# Patient Record
Sex: Female | Born: 1980 | Race: White | Hispanic: No | Marital: Married | Smoking: Never smoker
Health system: Southern US, Community
[De-identification: ages and names within clinical notes are randomized; demographics above are authoritative.]

## PROBLEM LIST (undated history)

## (undated) DIAGNOSIS — IMO0001 Reserved for inherently not codable concepts without codable children: Secondary | ICD-10-CM

## (undated) DIAGNOSIS — Z8619 Personal history of other infectious and parasitic diseases: Secondary | ICD-10-CM

## (undated) DIAGNOSIS — Z789 Other specified health status: Secondary | ICD-10-CM

## (undated) DIAGNOSIS — Z87440 Personal history of urinary (tract) infections: Secondary | ICD-10-CM

## (undated) HISTORY — PX: NO PAST SURGERIES: SHX2092

## (undated) HISTORY — DX: Personal history of urinary (tract) infections: Z87.440

## (undated) HISTORY — DX: Personal history of other infectious and parasitic diseases: Z86.19

---

## 2008-07-22 ENCOUNTER — Inpatient Hospital Stay (HOSPITAL_COMMUNITY): Admission: AD | Admit: 2008-07-22 | Discharge: 2008-07-25 | Payer: Self-pay | Admitting: Obstetrics and Gynecology

## 2008-08-05 DIAGNOSIS — Z87440 Personal history of urinary (tract) infections: Secondary | ICD-10-CM

## 2008-08-05 HISTORY — DX: Personal history of urinary (tract) infections: Z87.440

## 2010-12-18 NOTE — H&P (Signed)
NAMEKAREY, SUTHERS            ACCOUNT NO.:  [id number]   MEDICAL RECORD NO.:  [id number]          PATIENT TYPE:  INP   LOCATION:  9198                          FACILITY:  [facility]   PHYSICIAN:  Crist Fat. Rivard, M.D. DATE OF BIRTH:  08/26/1980   DATE OF ADMISSION:  07/22/2008  DATE OF DISCHARGE:                              HISTORY & PHYSICAL   Ms. Beevers is a 30 year old gravida 1 at 40 weeks and 1 day gestation  who presents with increased contractions and  a history of being 2 cm in  the office yesterday.  Her pregnancy was remarkable for having a narrow  pelvic outlet noted on her initial exam.  Her prenatal labs include an  initial prenatal panel that showed a blood type of O positive, antibody  screen negative.  Her initial hematocrit was 36.2.  Her initial  hemoglobin was 12.6.  Her platelets were 244.  RPR was nonreactive  Rubella titer immune.  Hepatitis B negative.  HIV nonreactive.  Negative  gonorrhea.  Negative chlamydia. Normal Pap was done in June of 2009 at  another practice.  She declines alpha-fetoprotein.  Her Glucola at  approximately [redacted] weeks gestation was normal at 127.  At that time her  hemoglobin was 12.5 and her group B strep culture of the vagina was  negative on November 19.   CURRENT MEDICATIONS:  Prenatal vitamins only.   HISTORY OF PRESENT PREGNANCY ONLY:  Ms. Derhonda entered prenatal care  at [facility] with new OB interview on June 30.  On July 8  she had her new OB exam.  She had previously had an early ultrasound for  dating and a first trimester screen at [facility].  She was 16  weeks and 6 days at her first visit with [place].  In the middle of July she  saw a dermatologist for wart removal on her finger.  Per the records,  her cystic fibrosis screen was negative and her first trimester screen  was normal and an ultrasound was done for growth at our office at 18  weeks.  Size was consistent with date.  Cervix was 3.77 cm long  with  posterior placenta and the anatomy was seen to be complete.  At 26 weeks  she had normal Glucola and hemoglobin of 12.5.  She was offered the H1N1  vaccine and declined it.  She was encouraged to get the seasonal flu  vaccine, also declined that.  Her group B beta strep test was negative  on November 19 and the final month of her pregnancy was unremarkable.   OBSTETRIC HISTORY:  This is her first pregnancy.   ALLERGIES:  She has no known allergies.   MEDICAL HISTORY:  She has no ongoing health problems.  She did have a  traumatic injury to her left arm at age 72, a compound fracture, and her  surgical history includes extraction of wisdom teeth as well as surgical  correction of the compound fracture on her left arm.   GENETIC HISTORY:  Noncontributory.  Negative cystic fibrosis screen.   SOCIAL HISTORY:  She is married to  Silvia Hightower, who is a  chiropractic doctor.  Eldena Swigert, the patient, has a Energy manager  degree and her occupation is Dance movement psychotherapist.  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  She is Caucasian and she denies use of  alcohol, tobacco, or street drugs during the pregnancy.   PHYSICAL EXAMINATION:  VITAL SIGNS:  Stable.  She is afebrile.  HEENT:  Within normal limits.  LUNGS:  Clear to auscultation.  HEART:  Regular rate and rhythm.  BREASTS:  Soft, nontender.  ABDOMEN:  Gravid.  Soft to palpation between contractions.  Fetal heart  rate 130, reactive, and reassuring with multiple accelerations and  obvious fetal activity.  EXTREMITIES:  No edema of extremities.  DTRs +3, +3.  Negative Homans  and clonus x2.   Vag exam at 1845 showed the cervix to be dilated 5 cm, 100% effaced, and  -1 station with a vertex presentation.  Patient got up to ambulate and  had a spontaneous rupture of membrane at 1851 with clear fluid running  down her legs.   IMPRESSION:  A 30 year old gravida 1 at 40.1 weeks, negative group B  streptococcus, narrow  pelvic outlet, active labor.   PLAN:  Admit to birthing suites under CNM management.  Routine orders  are initiated.  Dr. Estanislado Pandy has been consulted and advised.      Janna Melsness, CNM      ______________________________  Crist Fat Rivard, M.D.    JM/MEDQ  D:  07/22/2008  T:  07/22/2008  Job:  [id number]

## 2011-05-10 LAB — CBC
HCT: 41.8 % (ref 36.0–46.0)
Hemoglobin: 14.3 g/dL (ref 12.0–15.0)
MCHC: 34.3 g/dL (ref 30.0–36.0)
MCHC: 34.3 g/dL (ref 30.0–36.0)
MCV: 97.2 fL (ref 78.0–100.0)
Platelets: 152 10*3/uL (ref 150–400)
Platelets: 174 10*3/uL (ref 150–400)
RDW: 13.1 % (ref 11.5–15.5)
WBC: 13.4 10*3/uL — ABNORMAL HIGH (ref 4.0–10.5)

## 2011-08-06 NOTE — L&D Delivery Note (Signed)
Delivery Note At 8:40 AM a viable female was delivered via Vaginal, Spontaneous Delivery (Presentation: Right Occiput Anterior).  APGAR:8 ,8 ; weight .   Placenta status: Intact, Spontaneous schultze.  Cord: 3 vessels.  Anesthesia: Epidural  Episiotomy: None Lacerations: 2nd degree Suture Repair: 3.0 monocryl Est. Blood Loss (mL): 200  Mom to postpartum.  Baby to rooming in.  Oseias Horsey 02/24/2012, 9:21 AM

## 2011-09-05 LAB — ANTIBODY SCREEN: Antibody Screen: NEGATIVE

## 2011-09-05 LAB — HIV ANTIBODY (ROUTINE TESTING W REFLEX): HIV: NONREACTIVE

## 2011-09-05 LAB — CBC
HCT: 38 % (ref 36–46)
Hemoglobin: 13.3 g/dL (ref 12.0–16.0)
Platelets: 243 10*3/uL (ref 150–399)

## 2011-09-05 LAB — GC/CHLAMYDIA PROBE AMP, GENITAL
Chlamydia: NEGATIVE
Gonorrhea: NEGATIVE

## 2011-09-05 LAB — ABO/RH: RH Type: POSITIVE

## 2011-09-05 LAB — RPR: RPR: NONREACTIVE

## 2011-09-05 LAB — HEPATITIS B SURFACE ANTIGEN: Hepatitis B Surface Ag: NEGATIVE

## 2011-10-17 ENCOUNTER — Encounter (INDEPENDENT_AMBULATORY_CARE_PROVIDER_SITE_OTHER): Payer: Medicaid Other

## 2011-10-17 DIAGNOSIS — Z331 Pregnant state, incidental: Secondary | ICD-10-CM

## 2011-11-14 ENCOUNTER — Ambulatory Visit (INDEPENDENT_AMBULATORY_CARE_PROVIDER_SITE_OTHER): Payer: Medicaid Other | Admitting: Obstetrics and Gynecology

## 2011-11-14 ENCOUNTER — Other Ambulatory Visit: Payer: Medicaid Other

## 2011-11-14 VITALS — BP 112/66 | Ht 66.5 in | Wt 141.0 lb

## 2011-11-14 DIAGNOSIS — Z331 Pregnant state, incidental: Secondary | ICD-10-CM

## 2011-11-14 LAB — HEMOGLOBIN: Hemoglobin: 12 g/dL (ref 12.0–15.0)

## 2011-11-14 NOTE — Progress Notes (Signed)
   The patient  has no unusual complaints except occ mild cramping

## 2011-11-14 NOTE — Progress Notes (Signed)
GLUCOLA GIVEN TODAY.

## 2011-11-25 ENCOUNTER — Telehealth: Payer: Self-pay | Admitting: Obstetrics and Gynecology

## 2011-11-25 NOTE — Telephone Encounter (Signed)
Routed to triage 

## 2011-11-25 NOTE — Telephone Encounter (Signed)
lmo vm dental note faxed to dentist

## 2011-11-29 ENCOUNTER — Encounter: Payer: Medicaid Other | Admitting: Obstetrics and Gynecology

## 2011-11-29 ENCOUNTER — Ambulatory Visit (INDEPENDENT_AMBULATORY_CARE_PROVIDER_SITE_OTHER): Payer: Medicaid Other | Admitting: Obstetrics and Gynecology

## 2011-11-29 VITALS — BP 108/66 | Wt 142.0 lb

## 2011-11-29 DIAGNOSIS — Z331 Pregnant state, incidental: Secondary | ICD-10-CM

## 2011-11-29 NOTE — Progress Notes (Signed)
Glucola 116.  RPR nonreactive.  Hemoglobin 12.  Doing well.  Return to office in 2 weeks.  Dr. Stefano Gaul

## 2011-12-17 ENCOUNTER — Ambulatory Visit (INDEPENDENT_AMBULATORY_CARE_PROVIDER_SITE_OTHER): Payer: Medicaid Other | Admitting: Obstetrics and Gynecology

## 2011-12-17 ENCOUNTER — Encounter: Payer: Self-pay | Admitting: Obstetrics and Gynecology

## 2011-12-17 VITALS — BP 90/54 | Wt 148.0 lb

## 2011-12-17 DIAGNOSIS — Z331 Pregnant state, incidental: Secondary | ICD-10-CM

## 2011-12-17 NOTE — Progress Notes (Signed)
No concerns per pt 

## 2011-12-17 NOTE — Progress Notes (Signed)
Patient ID: Brittany Atkinson, female   DOB: 1981/03/04, 31 y.o.   MRN: [id number] Reviewed s/s preterm labor, srom, vag bleeding, kick counts to report, enc 8 water daily and frequent voids Lavera Guise, CNM

## 2011-12-27 ENCOUNTER — Encounter: Payer: Medicaid Other | Admitting: Obstetrics and Gynecology

## 2011-12-31 ENCOUNTER — Ambulatory Visit (INDEPENDENT_AMBULATORY_CARE_PROVIDER_SITE_OTHER): Payer: Medicaid Other | Admitting: Obstetrics and Gynecology

## 2011-12-31 ENCOUNTER — Encounter: Payer: Self-pay | Admitting: Obstetrics and Gynecology

## 2011-12-31 VITALS — BP 100/62 | Wt 153.0 lb

## 2011-12-31 DIAGNOSIS — O0930 Supervision of pregnancy with insufficient antenatal care, unspecified trimester: Secondary | ICD-10-CM

## 2011-12-31 DIAGNOSIS — F329 Major depressive disorder, single episode, unspecified: Secondary | ICD-10-CM

## 2011-12-31 DIAGNOSIS — Z331 Pregnant state, incidental: Secondary | ICD-10-CM

## 2011-12-31 DIAGNOSIS — F3289 Other specified depressive episodes: Secondary | ICD-10-CM

## 2011-12-31 NOTE — Patient Instructions (Signed)
Fetal Movement Counts Patient Name: __________________________________________________ Patient Due Date: ____________________ Kick counts is highly recommended in high risk pregnancies, but it is a good idea for every pregnant woman to do. Start counting fetal movements at 28 weeks of the pregnancy. Fetal movements increase after eating a full meal or eating or drinking something sweet (the blood sugar is higher). It is also important to drink plenty of fluids (well hydrated) before doing the count. Lie on your left side because it helps with the circulation or you can sit in a comfortable chair with your arms over your belly (abdomen) with no distractions around you. DOING THE COUNT  Try to do the count the same time of day each time you do it.   Mark the day and time, then see how long it takes for you to feel 10 movements (kicks, flutters, swishes, rolls). You should have at least 10 movements within 2 hours. You will most likely feel 10 movements in much less than 2 hours. If you do not, wait an hour and count again. After a couple of days you will see a pattern.   What you are looking for is a change in the pattern or not enough counts in 2 hours. Is it taking longer in time to reach 10 movements?  SEEK MEDICAL CARE IF:  You feel less than 10 counts in 2 hours. Tried twice.   No movement in one hour.   The pattern is changing or taking longer each day to reach 10 counts in 2 hours.   You feel the baby is not moving as it usually does.  Date: ____________ Movements: ____________ Start time: ____________ Finish time: ____________  Date: ____________ Movements: ____________ Start time: ____________ Finish time: ____________ Date: ____________ Movements: ____________ Start time: ____________ Finish time: ____________ Date: ____________ Movements: ____________ Start time: ____________ Finish time: ____________ Date: ____________ Movements: ____________ Start time: ____________ Finish time:  ____________ Date: ____________ Movements: ____________ Start time: ____________ Finish time: ____________ Date: ____________ Movements: ____________ Start time: ____________ Finish time: ____________ Date: ____________ Movements: ____________ Start time: ____________ Finish time: ____________  Date: ____________ Movements: ____________ Start time: ____________ Finish time: ____________ Date: ____________ Movements: ____________ Start time: ____________ Finish time: ____________ Date: ____________ Movements: ____________ Start time: ____________ Finish time: ____________ Date: ____________ Movements: ____________ Start time: ____________ Finish time: ____________ Date: ____________ Movements: ____________ Start time: ____________ Finish time: ____________ Date: ____________ Movements: ____________ Start time: ____________ Finish time: ____________ Date: ____________ Movements: ____________ Start time: ____________ Finish time: ____________  Date: ____________ Movements: ____________ Start time: ____________ Finish time: ____________ Date: ____________ Movements: ____________ Start time: ____________ Finish time: ____________ Date: ____________ Movements: ____________ Start time: ____________ Finish time: ____________ Date: ____________ Movements: ____________ Start time: ____________ Finish time: ____________ Date: ____________ Movements: ____________ Start time: ____________ Finish time: ____________ Date: ____________ Movements: ____________ Start time: ____________ Finish time: ____________ Date: ____________ Movements: ____________ Start time: ____________ Finish time: ____________  Date: ____________ Movements: ____________ Start time: ____________ Finish time: ____________ Date: ____________ Movements: ____________ Start time: ____________ Finish time: ____________ Date: ____________ Movements: ____________ Start time: ____________ Finish time: ____________ Date: ____________ Movements:  ____________ Start time: ____________ Finish time: ____________ Date: ____________ Movements: ____________ Start time: ____________ Finish time: ____________ Date: ____________ Movements: ____________ Start time: ____________ Finish time: ____________ Date: ____________ Movements: ____________ Start time: ____________ Finish time: ____________  Date: ____________ Movements: ____________ Start time: ____________ Finish time: ____________ Date: ____________ Movements: ____________ Start time: ____________ Finish time: ____________ Date: ____________ Movements: ____________ Start time:   ____________ Finish time: ____________ Date: ____________ Movements: ____________ Start time: ____________ Finish time: ____________ Date: ____________ Movements: ____________ Start time: ____________ Finish time: ____________ Date: ____________ Movements: ____________ Start time: ____________ Finish time: ____________ Date: ____________ Movements: ____________ Start time: ____________ Finish time: ____________  Date: ____________ Movements: ____________ Start time: ____________ Finish time: ____________ Date: ____________ Movements: ____________ Start time: ____________ Finish time: ____________ Date: ____________ Movements: ____________ Start time: ____________ Finish time: ____________ Date: ____________ Movements: ____________ Start time: ____________ Finish time: ____________ Date: ____________ Movements: ____________ Start time: ____________ Finish time: ____________ Date: ____________ Movements: ____________ Start time: ____________ Finish time: ____________ Date: ____________ Movements: ____________ Start time: ____________ Finish time: ____________  Date: ____________ Movements: ____________ Start time: ____________ Finish time: ____________ Date: ____________ Movements: ____________ Start time: ____________ Finish time: ____________ Date: ____________ Movements: ____________ Start time: ____________ Finish  time: ____________ Date: ____________ Movements: ____________ Start time: ____________ Finish time: ____________ Date: ____________ Movements: ____________ Start time: ____________ Finish time: ____________ Date: ____________ Movements: ____________ Start time: ____________ Finish time: ____________ Date: ____________ Movements: ____________ Start time: ____________ Finish time: ____________  Date: ____________ Movements: ____________ Start time: ____________ Finish time: ____________ Date: ____________ Movements: ____________ Start time: ____________ Finish time: ____________ Date: ____________ Movements: ____________ Start time: ____________ Finish time: ____________ Date: ____________ Movements: ____________ Start time: ____________ Finish time: ____________ Date: ____________ Movements: ____________ Start time: ____________ Finish time: ____________ Date: ____________ Movements: ____________ Start time: ____________ Finish time: ____________ Document Released: 08/21/2006 Document Revised: 07/11/2011 Document Reviewed: 02/21/2009 ExitCare Patient Information 2012 [employer].  Preventing Preterm Labor Preterm labor is when a pregnant woman has contractions that cause the cervix to open, shorten, and thin before 37 weeks of pregnancy. You will have regular contractions (tightening) 2 to 3 minutes apart. This usually causes discomfort or pain. HOME CARE  Eat a healthy diet.   Take your vitamins as told by your doctor.   Drink enough fluids to keep your pee (urine) clear or pale yellow every day.   Get rest and sleep.   Do not have sex if you are at high risk for preterm labor.   Follow your doctor's advice about activity, medicines, and tests.   Avoid stress.   Avoid hard labor or exercise that lasts for a long time.   Do not smoke.  GET HELP RIGHT AWAY IF:   You are having contractions.   You have belly (abdominal) pain.   You have bleeding from your vagina.   You  have pain when you pee (urinate).   You have abnormal discharge from your vagina.   You have a temperature by mouth above 102 F (38.9 C).  MAKE SURE YOU:  Understand these instructions.   Will watch your condition.   Will get help if you are not doing well or get worse.  Document Released: 10/18/2008 Document Revised: 07/11/2011 Document Reviewed: 10/18/2008 Exit[employer] Patient Information 2012 [employer]. 

## 2011-12-31 NOTE — Progress Notes (Signed)
[redacted]w[redacted]d Doing well, GFM No ctx, rec maternity belt for comfort, feels "more" this pregnancy rv'd PTL sx's and FKC GBS [place]

## 2011-12-31 NOTE — Progress Notes (Signed)
C/o increased pelvic pressure.

## 2012-01-01 DIAGNOSIS — F329 Major depressive disorder, single episode, unspecified: Secondary | ICD-10-CM | POA: Insufficient documentation

## 2012-01-01 DIAGNOSIS — F32A Depression, unspecified: Secondary | ICD-10-CM | POA: Insufficient documentation

## 2012-01-16 ENCOUNTER — Encounter: Payer: Self-pay | Admitting: Obstetrics and Gynecology

## 2012-01-16 ENCOUNTER — Ambulatory Visit (INDEPENDENT_AMBULATORY_CARE_PROVIDER_SITE_OTHER): Payer: Medicaid Other | Admitting: Obstetrics and Gynecology

## 2012-01-16 VITALS — BP 98/68 | Wt 157.0 lb

## 2012-01-16 DIAGNOSIS — Z331 Pregnant state, incidental: Secondary | ICD-10-CM

## 2012-01-16 DIAGNOSIS — L989 Disorder of the skin and subcutaneous tissue, unspecified: Secondary | ICD-10-CM

## 2012-01-16 NOTE — Progress Notes (Signed)
Doing well.  ? Spider bite over weekend--now with ulceration on right upper leg, with slight erythema and approx 2 cm induration around the central lesion.  No appearance of RMSF bulls-eye lesion.  Wound culture done.  Patient will do warm soaks, declines ATB at present.  I will f/u on culture results and f/u with patient. GBS today. Vtx to [place].

## 2012-01-18 LAB — WOUND CULTURE
Gram Stain: NONE SEEN
Organism ID, Bacteria: NO GROWTH

## 2012-01-19 ENCOUNTER — Encounter: Payer: Self-pay | Admitting: Obstetrics and Gynecology

## 2012-01-19 DIAGNOSIS — O99820 Streptococcus B carrier state complicating pregnancy: Secondary | ICD-10-CM | POA: Insufficient documentation

## 2012-01-29 ENCOUNTER — Ambulatory Visit (INDEPENDENT_AMBULATORY_CARE_PROVIDER_SITE_OTHER): Payer: Medicaid Other | Admitting: Obstetrics and Gynecology

## 2012-01-29 ENCOUNTER — Telehealth: Payer: Self-pay | Admitting: Obstetrics and Gynecology

## 2012-01-29 VITALS — BP 104/60 | Wt 158.0 lb

## 2012-01-29 DIAGNOSIS — Z331 Pregnant state, incidental: Secondary | ICD-10-CM

## 2012-01-29 NOTE — Progress Notes (Signed)
Doing well. Return office in 1 week. Dr. Dewell Monnier 

## 2012-01-29 NOTE — Telephone Encounter (Signed)
Brittany Atkinson/pt will call back

## 2012-01-29 NOTE — Progress Notes (Signed)
During checkout, Misty Stanley states pt forgot to ask Dr about GBS. Dr at lunch. Consulted with SL GBS positive. Pt questioned is there anything she can do to cure it. Informed pt per SL its a normal bacteria and there isn't anything pt can do to cure it.

## 2012-01-29 NOTE — Telephone Encounter (Signed)
Pt calling you back

## 2012-01-29 NOTE — Telephone Encounter (Signed)
Pt request to speak with Dr. AVS regarding +GBS

## 2012-01-29 NOTE — Progress Notes (Signed)
Pt desires cervix check today. Pt just wants to know if it will be soon that she will deliver. Pt is concerned if baby is measuring "ok" and not to big or small.

## 2012-02-04 ENCOUNTER — Ambulatory Visit (INDEPENDENT_AMBULATORY_CARE_PROVIDER_SITE_OTHER): Payer: Medicaid Other | Admitting: Obstetrics and Gynecology

## 2012-02-04 ENCOUNTER — Encounter: Payer: Self-pay | Admitting: Obstetrics and Gynecology

## 2012-02-04 VITALS — BP 100/58 | Wt 158.0 lb

## 2012-02-04 DIAGNOSIS — Z3490 Encounter for supervision of normal pregnancy, unspecified, unspecified trimester: Secondary | ICD-10-CM

## 2012-02-04 DIAGNOSIS — Z331 Pregnant state, incidental: Secondary | ICD-10-CM

## 2012-02-04 NOTE — Progress Notes (Signed)
Reviewed + GBS status--patient questioned if repeat GBS could be done. I do not recommend, since I would continue to recommend GBS tx in labor, regardless of outcome. R&B of GBS treatment vs non-treatment reviewed, including risks of GBS septicemia and fetal death. Patient will make decision regarding acceptance vs declining GBS treatment. Birth plan reviewed--copy to hospital for scanning to chart. Cervix posterior, with external os 2 cm, internal os 1 cm, 50%, vtx -1.

## 2012-02-04 NOTE — Progress Notes (Signed)
Some vaginal irritation off/on. ? Would like to be retested for GBS. Request cx  Check.

## 2012-02-10 ENCOUNTER — Telehealth (HOSPITAL_COMMUNITY): Payer: Self-pay | Admitting: *Deleted

## 2012-02-10 ENCOUNTER — Encounter (HOSPITAL_COMMUNITY): Payer: Self-pay | Admitting: *Deleted

## 2012-02-10 NOTE — Telephone Encounter (Signed)
Preadmission screen  

## 2012-02-13 ENCOUNTER — Encounter: Payer: Self-pay | Admitting: Obstetrics and Gynecology

## 2012-02-13 ENCOUNTER — Ambulatory Visit (INDEPENDENT_AMBULATORY_CARE_PROVIDER_SITE_OTHER): Payer: Medicaid Other | Admitting: Obstetrics and Gynecology

## 2012-02-13 VITALS — BP 102/60 | Wt 160.0 lb

## 2012-02-13 DIAGNOSIS — Z331 Pregnant state, incidental: Secondary | ICD-10-CM

## 2012-02-13 DIAGNOSIS — Z3490 Encounter for supervision of normal pregnancy, unspecified, unspecified trimester: Secondary | ICD-10-CM

## 2012-02-13 NOTE — Progress Notes (Signed)
[redacted]w[redacted]d Desires repeat GBS -  Repeated - patient had been advised that it is not recommended to repeat this test but insisted SVE: 2/50%/-1, Vx. Will call patient with GBS result  ROB 1 week Labor precautions discussed

## 2012-02-13 NOTE — Progress Notes (Signed)
Pt would like to know if GBS can be repeated although she previously had a positive result. Pt request cervix check today.

## 2012-02-15 LAB — STREP B DNA PROBE: GBSP: POSITIVE

## 2012-02-18 ENCOUNTER — Telehealth: Payer: Self-pay | Admitting: Obstetrics and Gynecology

## 2012-02-18 NOTE — Telephone Encounter (Signed)
Tc from pt per telephone call. Told pt GBS=positive. Pt informed will be tx with ATB's during delivery. Provider will discuss results in detail @ next ROB appt. Pt voices understanding.

## 2012-02-18 NOTE — Telephone Encounter (Signed)
Triage/epic 

## 2012-02-18 NOTE — Telephone Encounter (Signed)
Lm on vm to cb per telephone call req test results.  

## 2012-02-21 ENCOUNTER — Encounter: Payer: Self-pay | Admitting: Obstetrics and Gynecology

## 2012-02-21 ENCOUNTER — Ambulatory Visit (INDEPENDENT_AMBULATORY_CARE_PROVIDER_SITE_OTHER): Payer: Medicaid Other | Admitting: Obstetrics and Gynecology

## 2012-02-21 VITALS — BP 118/68 | Wt 162.0 lb

## 2012-02-21 DIAGNOSIS — O480 Post-term pregnancy: Secondary | ICD-10-CM

## 2012-02-21 MED ORDER — CLOTRIMAZOLE 1 % EX CREA: TOPICAL_CREAM | Freq: Two times a day (BID) | CUTANEOUS | Status: DC

## 2012-02-21 NOTE — Progress Notes (Signed)
Pt requests cervix check, pt would like the bottom of her feet checked. She states her feet have rough skin. No itching Feet normal B.  Dry skin B A/P GBS positive Fetal kick counts reviewed Labor reviewed with pt All patients  questions answered Pt declined NST today and induction at 42 weeks.  R&B d/w pt and her family including but not limited to fetal distress and IUFD NST on Monday and BPP for EFW@NV 

## 2012-02-24 ENCOUNTER — Encounter (HOSPITAL_COMMUNITY): Payer: Self-pay | Admitting: *Deleted

## 2012-02-24 ENCOUNTER — Encounter (HOSPITAL_COMMUNITY): Payer: Self-pay | Admitting: Anesthesiology

## 2012-02-24 ENCOUNTER — Other Ambulatory Visit: Payer: Medicaid Other

## 2012-02-24 ENCOUNTER — Inpatient Hospital Stay (HOSPITAL_COMMUNITY)
Admission: AD | Admit: 2012-02-24 | Discharge: 2012-02-26 | DRG: 373 | Disposition: A | Discharge status: Home / Self Care | Payer: BC Managed Care – PPO | Source: Ambulatory Visit | Attending: Obstetrics and Gynecology | Admitting: Obstetrics and Gynecology

## 2012-02-24 ENCOUNTER — Inpatient Hospital Stay (HOSPITAL_COMMUNITY): Payer: BC Managed Care – PPO | Admitting: Anesthesiology

## 2012-02-24 DIAGNOSIS — O99892 Other specified diseases and conditions complicating childbirth: Secondary | ICD-10-CM | POA: Diagnosis present

## 2012-02-24 DIAGNOSIS — O2390 Unspecified genitourinary tract infection in pregnancy, unspecified trimester: Secondary | ICD-10-CM | POA: Diagnosis present

## 2012-02-24 DIAGNOSIS — O0930 Supervision of pregnancy with insufficient antenatal care, unspecified trimester: Secondary | ICD-10-CM

## 2012-02-24 DIAGNOSIS — Z22330 Carrier of Group B streptococcus: Secondary | ICD-10-CM

## 2012-02-24 DIAGNOSIS — F329 Major depressive disorder, single episode, unspecified: Secondary | ICD-10-CM

## 2012-02-24 DIAGNOSIS — N390 Urinary tract infection, site not specified: Secondary | ICD-10-CM

## 2012-02-24 DIAGNOSIS — O99820 Streptococcus B carrier state complicating pregnancy: Secondary | ICD-10-CM

## 2012-02-24 DIAGNOSIS — IMO0001 Reserved for inherently not codable concepts without codable children: Secondary | ICD-10-CM

## 2012-02-24 HISTORY — DX: Reserved for inherently not codable concepts without codable children: IMO0001

## 2012-02-24 LAB — CBC
HCT: 39.8 % (ref 36.0–46.0)
MCV: 88.8 fL (ref 78.0–100.0)
RBC: 4.48 MIL/uL (ref 3.87–5.11)
WBC: 11.5 10*3/uL — ABNORMAL HIGH (ref 4.0–10.5)

## 2012-02-24 MED ORDER — SENNOSIDES-DOCUSATE SODIUM 8.6-50 MG PO TABS
2.0000 | ORAL_TABLET | Freq: Every day | ORAL | Status: DC
  Administered 2012-02-24 – 2012-02-25 (×2): 2 via ORAL

## 2012-02-24 MED ORDER — PRENATAL MULTIVITAMIN CH
1.0000 | ORAL_TABLET | Freq: Every day | ORAL | Status: DC
  Administered 2012-02-24 – 2012-02-26 (×3): 1 via ORAL
  Filled 2012-02-24 (×3): qty 1

## 2012-02-24 MED ORDER — DIBUCAINE 1 % RE OINT: 1.0000 "application " | TOPICAL_OINTMENT | RECTAL | Status: DC | PRN

## 2012-02-24 MED ORDER — LANOLIN HYDROUS EX OINT: TOPICAL_OINTMENT | CUTANEOUS | Status: DC | PRN

## 2012-02-24 MED ORDER — IBUPROFEN 600 MG PO TABS: 600.0000 mg | ORAL_TABLET | Freq: Four times a day (QID) | ORAL | Status: DC | PRN

## 2012-02-24 MED ORDER — OXYCODONE-ACETAMINOPHEN 5-325 MG PO TABS
1.0000 | ORAL_TABLET | ORAL | Status: DC | PRN
  Administered 2012-02-24 – 2012-02-26 (×6): 1 via ORAL
  Filled 2012-02-24 (×6): qty 1

## 2012-02-24 MED ORDER — EPHEDRINE 5 MG/ML INJ
10.0000 mg | INTRAVENOUS | Status: DC | PRN
  Filled 2012-02-24: qty 4

## 2012-02-24 MED ORDER — HYDROXYZINE HCL 50 MG PO TABS: 50.0000 mg | ORAL_TABLET | Freq: Four times a day (QID) | ORAL | Status: DC | PRN

## 2012-02-24 MED ORDER — ONDANSETRON HCL 4 MG/2ML IJ SOLN: 4.0000 mg | INTRAMUSCULAR | Status: DC | PRN

## 2012-02-24 MED ORDER — EPHEDRINE 5 MG/ML INJ: 10.0000 mg | INTRAVENOUS | Status: DC | PRN

## 2012-02-24 MED ORDER — FENTANYL 2.5 MCG/ML BUPIVACAINE 1/10 % EPIDURAL INFUSION (WH - ANES)
14.0000 mL/h | INTRAMUSCULAR | Status: DC
  Filled 2012-02-24 (×2): qty 60

## 2012-02-24 MED ORDER — CLOTRIMAZOLE 1 % EX CREA
TOPICAL_CREAM | Freq: Two times a day (BID) | CUTANEOUS | Status: DC
  Administered 2012-02-24 – 2012-02-26 (×5): via TOPICAL
  Filled 2012-02-24: qty 15

## 2012-02-24 MED ORDER — IBUPROFEN 600 MG PO TABS
600.0000 mg | ORAL_TABLET | Freq: Four times a day (QID) | ORAL | Status: DC
  Administered 2012-02-24 – 2012-02-26 (×8): 600 mg via ORAL
  Filled 2012-02-24 (×8): qty 1

## 2012-02-24 MED ORDER — PHENYLEPHRINE 40 MCG/ML (10ML) SYRINGE FOR IV PUSH (FOR BLOOD PRESSURE SUPPORT)
80.0000 ug | PREFILLED_SYRINGE | INTRAVENOUS | Status: DC | PRN
  Filled 2012-02-24: qty 5

## 2012-02-24 MED ORDER — LACTATED RINGERS IV SOLN: INTRAVENOUS | Status: DC

## 2012-02-24 MED ORDER — PHENYLEPHRINE 40 MCG/ML (10ML) SYRINGE FOR IV PUSH (FOR BLOOD PRESSURE SUPPORT): 80.0000 ug | PREFILLED_SYRINGE | INTRAVENOUS | Status: DC | PRN

## 2012-02-24 MED ORDER — BENZOCAINE-MENTHOL 20-0.5 % EX AERO
1.0000 "application " | INHALATION_SPRAY | CUTANEOUS | Status: DC | PRN
  Administered 2012-02-24 – 2012-02-25 (×2): 1 via TOPICAL
  Filled 2012-02-24 (×3): qty 56

## 2012-02-24 MED ORDER — OXYTOCIN 40 UNITS IN LACTATED RINGERS INFUSION - SIMPLE MED
62.5000 mL/h | Freq: Once | INTRAVENOUS | Status: DC
  Filled 2012-02-24: qty 1000

## 2012-02-24 MED ORDER — SIMETHICONE 80 MG PO CHEW: 80.0000 mg | CHEWABLE_TABLET | ORAL | Status: DC | PRN

## 2012-02-24 MED ORDER — FENTANYL 2.5 MCG/ML BUPIVACAINE 1/10 % EPIDURAL INFUSION (WH - ANES)
INTRAMUSCULAR | Status: DC | PRN
  Administered 2012-02-24: 14 mL/h via EPIDURAL

## 2012-02-24 MED ORDER — LACTATED RINGERS IV SOLN: 500.0000 mL | INTRAVENOUS | Status: DC | PRN

## 2012-02-24 MED ORDER — OXYCODONE-ACETAMINOPHEN 5-325 MG PO TABS: 1.0000 | ORAL_TABLET | ORAL | Status: DC | PRN

## 2012-02-24 MED ORDER — PENICILLIN G POTASSIUM 5000000 UNITS IJ SOLR
2.5000 10*6.[IU] | INTRAVENOUS | Status: DC
  Filled 2012-02-24 (×3): qty 2.5

## 2012-02-24 MED ORDER — ZOLPIDEM TARTRATE 5 MG PO TABS: 5.0000 mg | ORAL_TABLET | Freq: Every evening | ORAL | Status: DC | PRN

## 2012-02-24 MED ORDER — TETANUS-DIPHTH-ACELL PERTUSSIS 5-2.5-18.5 LF-MCG/0.5 IM SUSP
0.5000 mL | Freq: Once | INTRAMUSCULAR | Status: AC
  Administered 2012-02-25: 0.5 mL via INTRAMUSCULAR
  Filled 2012-02-24: qty 0.5

## 2012-02-24 MED ORDER — LACTATED RINGERS IV SOLN: 500.0000 mL | Freq: Once | INTRAVENOUS | Status: DC

## 2012-02-24 MED ORDER — WITCH HAZEL-GLYCERIN EX PADS
1.0000 "application " | MEDICATED_PAD | CUTANEOUS | Status: DC | PRN
  Administered 2012-02-24: 1 via TOPICAL

## 2012-02-24 MED ORDER — DIPHENHYDRAMINE HCL 25 MG PO CAPS
25.0000 mg | ORAL_CAPSULE | Freq: Four times a day (QID) | ORAL | Status: DC | PRN
Start: 2012-02-24 — End: 2012-02-26

## 2012-02-24 MED ORDER — AMPICILLIN SODIUM 2 G IJ SOLR
2.0000 g | Freq: Once | INTRAMUSCULAR | Status: DC
Start: 2012-02-24 — End: 2012-02-24
  Filled 2012-02-24: qty 2000

## 2012-02-24 MED ORDER — ONDANSETRON HCL 4 MG/2ML IJ SOLN: 4.0000 mg | Freq: Four times a day (QID) | INTRAMUSCULAR | Status: DC | PRN

## 2012-02-24 MED ORDER — OXYTOCIN BOLUS FROM INFUSION
250.0000 mL | Freq: Once | INTRAVENOUS | Status: AC
  Administered 2012-02-24: 250 mL via INTRAVENOUS
  Filled 2012-02-24: qty 500

## 2012-02-24 MED ORDER — PENICILLIN G POTASSIUM 5000000 UNITS IJ SOLR
5.0000 10*6.[IU] | Freq: Once | INTRAVENOUS | Status: DC
  Filled 2012-02-24: qty 5

## 2012-02-24 MED ORDER — HYDROXYZINE HCL 50 MG/ML IM SOLN
50.0000 mg | Freq: Four times a day (QID) | INTRAMUSCULAR | Status: DC | PRN
  Filled 2012-02-24: qty 1

## 2012-02-24 MED ORDER — ONDANSETRON HCL 4 MG PO TABS: 4.0000 mg | ORAL_TABLET | ORAL | Status: DC | PRN

## 2012-02-24 MED ORDER — ACETAMINOPHEN 325 MG PO TABS: 650.0000 mg | ORAL_TABLET | ORAL | Status: DC | PRN

## 2012-02-24 MED ORDER — DIPHENHYDRAMINE HCL 50 MG/ML IJ SOLN: 12.5000 mg | INTRAMUSCULAR | Status: DC | PRN

## 2012-02-24 MED ORDER — FENTANYL 2.5 MCG/ML BUPIVACAINE 1/10 % EPIDURAL INFUSION (WH - ANES): 14.0000 mL/h | INTRAMUSCULAR | Status: DC

## 2012-02-24 MED ORDER — FLEET ENEMA 7-19 GM/118ML RE ENEM: 1.0000 | ENEMA | RECTAL | Status: DC | PRN

## 2012-02-24 MED ORDER — LIDOCAINE HCL (PF) 1 % IJ SOLN
INTRAMUSCULAR | Status: DC | PRN
  Administered 2012-02-24 (×2): 4 mL

## 2012-02-24 MED ORDER — LIDOCAINE HCL (PF) 1 % IJ SOLN
30.0000 mL | INTRAMUSCULAR | Status: DC | PRN
  Filled 2012-02-24: qty 30

## 2012-02-24 MED ORDER — CITRIC ACID-SODIUM CITRATE 334-500 MG/5ML PO SOLN: 30.0000 mL | ORAL | Status: DC | PRN

## 2012-02-24 NOTE — Progress Notes (Signed)
Subjective: Comfortable s/p epidural; initial hot spot on pt's Lt. Husband remains at bedside.  No pressure or urge to push.  Pt received Ampicillin for advanced dilatation.  Objective: BP 113/64  Pulse 79  Temp 98.1 F (36.7 C) (Oral)  Resp 20  Ht 5\' 6"  (1.676 m)  Wt 160 lb (72.576 kg)  BMI 25.82 kg/m2  SpO2 100%  LMP 05/10/2011      FHT:  FHR: 130 bpm, variability: moderate,  accelerations:  Present,  decelerations:  Absent UC:   regular, every 2-3 minutes SVE:   Dilation: 10 Effacement (%): 100 Station: -1 Exam by:: Norina Cowper, CNM  Moderate bloody show; RN just did in and out cath Labs: Lab Results  Component Value Date   WBC 11.5* 02/24/2012   HGB 13.6 02/24/2012   HCT 39.8 02/24/2012   MCV 88.8 02/24/2012   PLT 199 02/24/2012    Assessment / Plan: Spontaneous labor, progressing normally  Labor: Progressing normally Preeclampsia:  no signs or symptoms of toxicity Fetal Wellbeing:  Category I Pain Control:  Epidural I/D:  n/a Anticipated MOD:  NSVD 1.  Rec'd laboring down secondary to fetal station -1 and pt w/o urge to push.  (pt pushed for "2hrs" w/ first delivery) 2.  Begin 2nd stage; c/w MD prn. Ambriel Gorelick H 02/24/2012, 6:00 AM

## 2012-02-24 NOTE — Progress Notes (Signed)
Addendum to delivery record. Loose nuchal cord x 1 slipped with delivery of the head, easy delivery of the shoulders, baby placed on pt abd. Lavera Guise, CNM

## 2012-02-24 NOTE — Anesthesia Preprocedure Evaluation (Signed)
Anesthesia Evaluation  Patient identified by MRN, date of birth, ID band Patient awake    Reviewed: Allergy & Precautions, H&P , Patient's Chart, lab work & pertinent test results  Airway Mallampati: III TM Distance: >3 FB Neck ROM: full    Dental No notable dental hx. (+) Teeth Intact   Pulmonary neg pulmonary ROS,  breath sounds clear to auscultation  Pulmonary exam normal       Cardiovascular negative cardio ROS  Rhythm:regular Rate:Normal     Neuro/Psych Depression negative neurological ROS     GI/Hepatic negative GI ROS, Neg liver ROS,   Endo/Other  negative endocrine ROS  Renal/GU negative Renal ROS  negative genitourinary   Musculoskeletal   Abdominal Normal abdominal exam  (+)   Peds  Hematology negative hematology ROS (+)   Anesthesia Other Findings   Reproductive/Obstetrics (+) Pregnancy                           Anesthesia Physical Anesthesia Plan  ASA: II  Anesthesia Plan: Epidural   Post-op Pain Management:    Induction:   Airway Management Planned:   Additional Equipment:   Intra-op Plan:   Post-operative Plan:   Informed Consent: I have reviewed the patients History and Physical, chart, labs and discussed the procedure including the risks, benefits and alternatives for the proposed anesthesia with the patient or authorized representative who has indicated his/her understanding and acceptance.     Plan Discussed with: Anesthesiologist  Anesthesia Plan Comments:         Anesthesia Quick Evaluation

## 2012-02-24 NOTE — H&P (Signed)
Brittany Atkinson is a 31 y.o.MW female presenting at [redacted]w[redacted]d with CC of SROM at 2330 last night, w/ "yellow-tinge," and onset of ctxs thereafter.  Cx at last office exam 2-3 cm.  Pt reports GFM.  No UTI or PIH s/s.  No recent illness or fever.  No resp or GI c/o's.  Loose stools noted a few days ago.  Accompanied to hospital by her husband.   Prenatal Course: Pt entered care at [facility] around 16 weeks.  NOB w/u on 09/04/11; treated for BV at that time.  Had normal anatomy u/s, and negative Quad screen.  Pt tearful and feeling s/s of "depression" around [redacted]w[redacted]d, but declined referral or medicinal treatment, and self-comfort measures and techniques instituted w/ good benefit.  Pt's pregnancy has progressed since with no other complications.  She did have a suspected spider bite around [redacted]w[redacted]d on Rt upper leg, but no lingering issues.  She did have two GBS cx's done per her request, and both positive (01/16/12 & 02/13/12). Pregnancy r/f:    Patient Active Problem List  Diagnosis  . Pregnant state, incidental  . Late prenatal care - 16wks  . Depression - antenatal?   . GBS (group B Streptococcus carrier), +RV culture, currently pregnant  . Active labor  . Labor and delivery complicated by meconium in amniotic fluid  OB HX: G1=SVD 2009, female=8+10 "Liam," by VL; no complications at 41 weeks G2=current  Maternal Medical History:  Reason for admission: Reason for admission: rupture of membranes and contractions.  Contractions: Onset was 3-5 hours ago.   Frequency: regular.   Perceived severity is moderate.    Fetal activity: Perceived fetal activity is normal.   Last perceived fetal movement was within the past hour.      OB History    Grav Para Term Preterm Abortions TAB SAB Ect Mult Living   2 1 1       1      Past Medical History  Diagnosis Date  . Hx: UTI (urinary tract infection) 2010  . H/O varicella   . H/O toxoplasmosis   . Active labor 02/24/2012  . Labor and delivery complicated by  meconium in amniotic fluid 02/24/2012   Past Surgical History  Procedure Date  . No past surgeries    Family History: family history includes Cancer in her maternal grandmother and paternal grandmother; Parkinsonism in her maternal grandfather; and Seizures in her maternal grandmother. Social History:  reports that she has never smoked. She has never used smokeless tobacco. She reports that she does not drink alcohol or use illicit drugs.Pt is p/t Equities trader education.  Husband "Thayer Ohm" involved and supportive; has doctorate.     Prenatal Transfer Tool  Maternal Diabetes: No Genetic Screening: Normal Maternal Ultrasounds/Referrals: Normal Fetal Ultrasounds or other Referrals:  None Maternal Substance Abuse:  No Significant Maternal Medications:  None Significant Maternal Lab Results:  Lab values include: Group B Strep positive Other Comments:  None  Review of Systems  Constitutional: Negative.   HENT: Negative.   Eyes: Negative.   Respiratory: Negative.   Cardiovascular: Negative.   Gastrointestinal: Negative.   Genitourinary: Negative.   Skin: Negative.   Neurological: Negative.     Dilation: 5 Effacement (%): 100 Station: -1 Exam by:: Ardean Melroy,CNM Blood pressure 118/68, pulse 79, temperature 98 F (36.7 C), temperature source Oral, resp. rate 18, height 5\' 6"  (1.676 m), weight 160 lb (72.576 kg), last menstrual period 05/10/2011. Maternal Exam:  Uterine Assessment: Contraction strength is moderate.  Contraction frequency  is regular.  uc's q 2-4  Abdomen: Patient reports no abdominal tenderness. Fetal presentation: vertex  Introitus: Normal vulva. Normal vagina.  Ferning test: negative.  Nitrazine test: not done. Amniotic fluid character: meconium stained.  Pelvis: adequate for delivery.   Cervix: Cervix evaluated by sterile speculum exam and digital exam.     Fetal Exam Fetal Monitor Review: Mode: ultrasound.   Baseline rate: 125.  Variability: moderate  (6-25 bpm).   Pattern: accelerations present and early decelerations.    Fetal State Assessment: Category I - tracings are normal.     Physical Exam  Constitutional: She appears well-developed and well-nourished. She appears distressed.       Breathing, grimace, shaking    Prenatal labs: ABO, Rh: O/Positive/-- (01/31 0000) Antibody: Negative (01/31 0000) Rubella: Immune (01/31 0000) RPR: NON REAC (04/11 1446)  HBsAg: Negative (01/31 0000)  HIV: Non-reactive (01/31 0000)  GBS: POSITIVE (07/11 1234)  Pap & cx's negative 09/04/11 Quad screen negative 1hr gtt=116  Assessment/Plan: 1.  [redacted]w[redacted]d 2.  Active labor 3.  S/p SROM at 2330 02/23/12 w/ MSF 4.  GBS positive 5.  Cat I FHT  1.  Admit to BS with Dr. Pennie Rushing as attending 2.  Routine L&D orders 3.  Epidural ASAP 4.  Ampicillin for GBS prophylaxis secondary to advanced dilatation 5.  C/w MD prn  Clydia Nieves H 02/24/2012, 4:43 AM

## 2012-02-24 NOTE — Anesthesia Postprocedure Evaluation (Signed)
  Anesthesia Post-op Note  Patient: Brittany Atkinson  Procedure(s) Performed: * No procedures listed *  Patient Location: Mother/Baby  Anesthesia Type: Epidural  Level of Consciousness: awake  Airway and Oxygen Therapy: Patient Spontanous Breathing  Post-op Pain: mild  Post-op Assessment: Patient's Cardiovascular Status Stable and Respiratory Function Stable  Post-op Vital Signs: stable  Complications: No apparent anesthesia complications

## 2012-02-24 NOTE — Anesthesia Procedure Notes (Signed)
Epidural Patient location during procedure: OB Start time: 02/24/2012 4:50 AM  Staffing Anesthesiologist: Coleman Kalas A. Performed by: anesthesiologist   Preanesthetic Checklist Completed: patient identified, site marked, surgical consent, pre-op evaluation, timeout performed, IV checked, risks and benefits discussed and monitors and equipment checked  Epidural Patient position: sitting Prep: site prepped and draped and DuraPrep Patient monitoring: continuous pulse ox and blood pressure Approach: midline Injection technique: LOR air  Needle:  Needle type: Tuohy  Needle gauge: 17 G Needle length: 9 cm Needle insertion depth: 5 cm cm Catheter type: closed end flexible Catheter size: 19 Gauge Catheter at skin depth: 10 cm Test dose: negative and Other  Assessment Events: blood not aspirated, injection not painful, no injection resistance, negative IV test and no paresthesia  Additional Notes Patient identified. Risks and benefits discussed including failed block, incomplete  Pain control, post dural puncture headache, nerve damage, paralysis, blood pressure Changes, nausea, vomiting, reactions to medications-both toxic and allergic and post Partum back pain. All questions were answered. Patient expressed understanding and wished to proceed. Sterile technique was used throughout procedure. Epidural site was Dressed with sterile barrier dressing. No paresthesias, signs of intravascular injection Or signs of intrathecal spread were encountered.  Patient was more comfortable after the epidural was dosed. Please see RN's note for documentation of vital signs and FHR which are stable.

## 2012-02-24 NOTE — MAU Note (Signed)
Pt reports ? ROM at 2300, contractions. 

## 2012-02-25 ENCOUNTER — Encounter (HOSPITAL_COMMUNITY): Payer: Self-pay | Admitting: *Deleted

## 2012-02-25 LAB — CBC
MCV: 90 fL (ref 78.0–100.0)
Platelets: 154 10*3/uL (ref 150–400)
RBC: 3.7 MIL/uL — ABNORMAL LOW (ref 3.87–5.11)
WBC: 12.5 10*3/uL — ABNORMAL HIGH (ref 4.0–10.5)

## 2012-02-25 NOTE — Progress Notes (Signed)
Post Partum Day 1 Subjective: no complaints, up ad lib, voiding, tolerating PO, + flatus and newborn asleep in bassinet.  Husband resting at bedside.  Pt eating breakfast.  VB lighter this morning.  reports mild discomfort, "stinging" when up to BR along perineum.  BF'ng going well.  Desire IP circ.  Objective: Blood pressure 97/61, pulse 77, temperature 97.9 F (36.6 C), temperature source Oral, resp. rate 16, height 5\' 6"  (1.676 m), weight 160 lb (72.576 kg), last menstrual period 05/10/2011, SpO2 97.00%, unknown if currently breastfeeding.  Physical Exam:  General: alert, cooperative, no distress and smiling, pleasant Lochia: appropriate, rubra Uterine Fundus: firm, below umbilicus Incision: n/a DVT Evaluation: No evidence of DVT seen on physical exam. Negative Homan's sign. No significant calf/ankle edema.   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  Mood stable at present.   LOS: 1 day   Gerilyn Stargell H 02/25/2012, 9:26 AM

## 2012-02-25 NOTE — Progress Notes (Signed)
UR chart review completed.  

## 2012-02-26 DIAGNOSIS — N390 Urinary tract infection, site not specified: Secondary | ICD-10-CM

## 2012-02-26 LAB — URINE MICROSCOPIC-ADD ON

## 2012-02-26 LAB — URINALYSIS, ROUTINE W REFLEX MICROSCOPIC
Leukocytes, UA: NEGATIVE
Nitrite: NEGATIVE
Specific Gravity, Urine: 1.025 (ref 1.005–1.030)
pH: 6 (ref 5.0–8.0)

## 2012-02-26 MED ORDER — OXYCODONE-ACETAMINOPHEN 5-325 MG PO TABS: 1.0000 | ORAL_TABLET | ORAL | Status: AC | PRN

## 2012-02-26 MED ORDER — IBUPROFEN 600 MG PO TABS: 600.0000 mg | ORAL_TABLET | Freq: Four times a day (QID) | ORAL | Status: AC | PRN

## 2012-02-26 MED ORDER — NITROFURANTOIN MONOHYD MACRO 100 MG PO CAPS: 100.0000 mg | ORAL_CAPSULE | Freq: Two times a day (BID) | ORAL | Status: AC

## 2012-02-26 MED ORDER — FLUCONAZOLE 200 MG PO TABS: 200.0000 mg | ORAL_TABLET | Freq: Every day | ORAL | Status: AC

## 2012-02-26 NOTE — Discharge Summary (Signed)
Physician Discharge Summary  Patient ID: Brittany Atkinson MRN: [id number] DOB/AGE: 1981-01-24 31 y.o.  Admit date: 02/24/2012 Discharge date: 02/26/2012  Admission Diagnoses: 41 week IUP active labor GBS positive  Discharge Diagnoses:  Principal Problem:  *Vaginal delivery Active Problems:  UTI (lower urinary tract infection)   Discharged Condition: stable  Hospital Course: active labor, epidural, SVD, 2 perineal lac, lactating, c/o burning with urination at discharge UA C&S pending, normal involution  Consults: None  Significant Diagnostic Studies: labs:  Results for orders placed during the hospital encounter of 02/24/12 (from the past 48 hour(s))  CBC     Status: Abnormal   Collection Time   02/25/12  5:00 AM      Component Value Range Comment   WBC 12.5 (*) 4.0 - 10.5 K/uL    RBC 3.70 (*) 3.87 - 5.11 MIL/uL    Hemoglobin 11.4 (*) 12.0 - 15.0 g/dL    HCT 40.9 (*) 81.1 - 46.0 %    MCV 90.0  78.0 - 100.0 fL    MCH 30.8  26.0 - 34.0 pg    MCHC 34.2  30.0 - 36.0 g/dL    RDW 91.4  78.2 - 95.6 %    Platelets 154  150 - 400 K/uL   URINALYSIS, ROUTINE W REFLEX MICROSCOPIC     Status: Abnormal   Collection Time   02/26/12  7:59 AM      Component Value Range Comment   Color, Urine YELLOW  YELLOW    APPearance CLEAR  CLEAR    Specific Gravity, Urine 1.025  1.005 - 1.030    pH 6.0  5.0 - 8.0    Glucose, UA NEGATIVE  NEGATIVE mg/dL    Hgb urine dipstick LARGE (*) NEGATIVE    Bilirubin Urine NEGATIVE  NEGATIVE    Ketones, ur NEGATIVE  NEGATIVE mg/dL    Protein, ur NEGATIVE  NEGATIVE mg/dL    Urobilinogen, UA 0.2  0.0 - 1.0 mg/dL    Nitrite NEGATIVE  NEGATIVE    Leukocytes, UA NEGATIVE  NEGATIVE   URINE MICROSCOPIC-ADD ON     Status: Normal   Collection Time   02/26/12  7:59 AM      Component Value Range Comment   Squamous Epithelial / LPF RARE  RARE    WBC, UA 0-2  <3 WBC/hpf    RBC / HPF 11-20  <3 RBC/hpf     Treatments: IV hydration  Discharge Exam: Blood pressure  98/64, pulse 72, temperature 97.9 F (36.6 C), temperature source Oral, resp. rate 18, height 5\' 6"  (1.676 m), weight 160 lb (72.576 kg), last menstrual period 05/10/2011, SpO2 97.00%, unknown if currently breastfeeding. General appearance: alert, cooperative and no distress S: crampy using motrin and percocet with relief, burning with urine this am,little bleeding, slept     feeding O VSS     abd soft, nt, ff      sm  Flow perineum clean intact 2 MLL well approximated no redness, edema, drainage     -Homans sign bilaterally,       no edema  Disposition: home with baby   Medication List  As of 02/26/2012  9:49 AM   STOP taking these medications         PROBIOTIC DAILY PO      VITAMIN E PO      ZINC PO         TAKE these medications         ALPHA LIPOIC ACID PO  Take 1 capsule by mouth daily.      Cod Liver Oil Caps   Take 1 capsule by mouth daily.      fish oil-omega-3 fatty acids 1000 MG capsule   Take 1 g by mouth daily.      fluconazole 200 MG tablet   Commonly known as: DIFLUCAN   Take 1 tablet (200 mg total) by mouth daily.      ibuprofen 600 MG tablet   Commonly known as: ADVIL,MOTRIN   Take 1 tablet (600 mg total) by mouth every 6 (six) hours as needed for pain.      liver oil-zinc oxide 40 % ointment   Commonly known as: DESITIN   Apply topically as needed.      nitrofurantoin (macrocrystal-monohydrate) 100 MG capsule   Commonly known as: MACROBID   Take 1 capsule (100 mg total) by mouth 2 (two) times daily.      oxyCODONE-acetaminophen 5-325 MG per tablet   Commonly known as: PERCOCET/ROXICET   Take 1-2 tablets by mouth every 4 (four) hours as needed (moderate - severe pain).      prenatal multivitamin Tabs   Take 1 tablet by mouth daily.      VITAMIN B COMPLEX PO   Take 1 tablet by mouth daily.      VITAMIN C PO   Take 1 tablet by mouth daily.           Follow-up Information    Follow up with [facility] in 6 weeks.        Discussed  probable UTI with UA C&S pending,  Rx macrobid and diflucan, 8 water daily. , discussed s/s depression, suicide thoughts to report reviewed; she was told to come back here or go to nearest ER for any worsening symptoms.  Will re-evaluate at next visit.     SignedLavera Guise 02/26/2012, 9:49 AM

## 2012-02-28 ENCOUNTER — Telehealth: Payer: Self-pay | Admitting: Obstetrics and Gynecology

## 2012-02-28 NOTE — Telephone Encounter (Signed)
Neg ua culture

## 2012-03-02 ENCOUNTER — Other Ambulatory Visit: Payer: Self-pay | Admitting: Obstetrics and Gynecology

## 2012-03-02 NOTE — Telephone Encounter (Signed)
TC to pt. LM to return call regarding message. 

## 2012-03-02 NOTE — Telephone Encounter (Signed)
Triage/epic 

## 2012-03-09 ENCOUNTER — Telehealth: Payer: Self-pay | Admitting: Obstetrics and Gynecology

## 2012-03-09 NOTE — Telephone Encounter (Signed)
Pt called has increased blding was using mini pad now redder and using regular size pad every few hours but is concerned that she has some irritation ?stitch loose cramping decreased  Scheduled for evaluation with [facility] on 03/10/12 @3 :45p DFaulconerRN

## 2012-03-09 NOTE — Telephone Encounter (Signed)
Triage/epic 

## 2012-03-10 ENCOUNTER — Encounter: Payer: Self-pay | Admitting: Obstetrics and Gynecology

## 2012-03-10 ENCOUNTER — Ambulatory Visit (INDEPENDENT_AMBULATORY_CARE_PROVIDER_SITE_OTHER): Payer: Medicaid Other | Admitting: Obstetrics and Gynecology

## 2012-03-10 VITALS — BP 118/66 | Wt 141.0 lb

## 2012-03-10 DIAGNOSIS — N926 Irregular menstruation, unspecified: Secondary | ICD-10-CM

## 2012-03-10 NOTE — Progress Notes (Signed)
When did bleeding start: 03/08/12 How  Long: still bleeding How often changing pad/tampon: every 3 hours  Bleeding Disorders: no Cramping: no Contraception: no Fibroids: no Hormone Therapy: no New Medications: no Menopausal Symptoms: no Vag. Discharge: no Abdominal Pain: no Increased Stress: no Pt is 2 weeks pp and states her bleeding had tapered off after delivery but started back heavy on Sunday. Pt would like to be checked to make sure tears are healing normally.   HISTORY OF PRESENT ILLNESS  Ms. Brittany Atkinson is a 31 y.o. year old female,G2P2002, who presents for a postpartum visit. She had a vaginal delivery 2 weeks ago.  Subjective:  The patient complains of increased lochia.  She denies fever or chills.  Objective:  BP 118/66  Wt 141 lb (63.957 kg)  Breastfeeding? Yes   GI: soft and nontender  External genitalia: normal general appearance and normal lochia.  Lacerations are healing well.  No sign of infection or bleeding. Vaginal: normal without tenderness, induration or masses Adnexa: normal bimanual exam Uterus: 8-10 weeks size, firm  Assessment:  Normal lochia by my examination.  No signs of infection.  Lacerations healing well.  Plan:  Normal postpartum activity.  Return to office in 4 week(s).   Leonard Schwartz M.D.  03/10/2012 5:21 PM

## 2012-03-12 NOTE — Telephone Encounter (Signed)
Pt has been seen 03/10/12

## 2012-04-02 ENCOUNTER — Encounter: Payer: Self-pay | Admitting: Obstetrics and Gynecology

## 2012-04-02 ENCOUNTER — Ambulatory Visit (INDEPENDENT_AMBULATORY_CARE_PROVIDER_SITE_OTHER): Payer: Medicaid Other | Admitting: Obstetrics and Gynecology

## 2012-04-02 VITALS — BP 90/72 | Resp 16 | Wt 136.0 lb

## 2012-04-02 DIAGNOSIS — IMO0001 Reserved for inherently not codable concepts without codable children: Secondary | ICD-10-CM

## 2012-04-02 DIAGNOSIS — Z309 Encounter for contraceptive management, unspecified: Secondary | ICD-10-CM

## 2012-04-02 MED ORDER — NORETHINDRONE 0.35 MG PO TABS: 1.0000 | ORAL_TABLET | Freq: Every day | ORAL | Status: DC

## 2012-04-02 NOTE — Progress Notes (Signed)
Brittany Atkinson is a 31 y.o. female who presents for a postpartum visit.   Patient reports doing well.   Seen approx 2 weeks pp for episode of bleeding--no issues, all findings WNL by AVS.   Hx remarkable for: 2nd degree laceration Vaginal delivery   PPDS = 3--denies pp depression  Contraception plan:  Micronor--needs Rx   I have fully reviewed the prenatal and intrapartum course   Patient has not been sexually active since delivery.   The following portions of the patient's history were reviewed and updated as appropriate: allergies, current medications, past family history, past medical history, past social history, past surgical history and problem list.  Review of Systems Pertinent items are noted in HPI.   Objective:    BP 90/72  Wt 136 lb (61.689 kg)  Breastfeeding? Yes  General:  alert, cooperative and no distress     Lungs: clear to auscultation bilaterally  Heart:  regular rate and rhythm, S1, S2 normal, no murmur  Abdomen: soft, non-tender; bowel sounds normal; no masses,  no organomegaly   Vulva:  normal  Vagina: normal vagina  Cervix:  normal  Uterus: normal size, contour, position, consistency, mobility, non-tender, well-involuted  Adnexa:  normal adnexa             Assessment:     Normal postpartum exam.  Pap smear not done at today's visit.   Due 1/14.  Plan:  Follow-up at annual 1/14. Rx Micronor--will switch back to Yasmin after breastfeeding.   Nigel Bridgeman CNM, [place] 04/02/2012 12:24 PM  Micronor, pap 1/14

## 2012-04-02 NOTE — Progress Notes (Signed)
Date of delivery: 02/24/12 Female Name: "Brittany Atkinson" Vaginal delivery:yes Cesarean section:no Tubal ligation:no GDM:no Breast Feeding:yes Bottle Feeding:no Post-Partum Blues:yes Abnormal pap:no 09/04/11 WNL Normal GU function: yes Normal GI function:yes Returning to work:yes EPDS: 3  Pt wants Micronor.

## 2012-04-03 ENCOUNTER — Telehealth: Payer: Self-pay | Admitting: Obstetrics and Gynecology

## 2012-04-03 NOTE — Telephone Encounter (Signed)
Triage /bc req °

## 2012-04-07 ENCOUNTER — Ambulatory Visit: Payer: Medicaid Other | Admitting: Obstetrics and Gynecology

## 2012-04-08 ENCOUNTER — Other Ambulatory Visit: Payer: Self-pay | Admitting: Obstetrics and Gynecology

## 2012-04-08 DIAGNOSIS — IMO0001 Reserved for inherently not codable concepts without codable children: Secondary | ICD-10-CM

## 2012-04-08 MED ORDER — NORETHINDRONE 0.35 MG PO TABS: 1.0000 | ORAL_TABLET | Freq: Every day | ORAL | Status: AC

## 2012-04-08 NOTE — Telephone Encounter (Signed)
Tc to pt regarding msg, lm on vm to call back. 

## 2012-04-09 NOTE — Telephone Encounter (Signed)
Late entry:pt returned call, wanting 3 packs of pills at a time of her Micronor.  Pt  Informed will e-prescribe 3 packs with rfs, pt voices agreement.

## 2012-08-13 ENCOUNTER — Telehealth: Payer: Self-pay | Admitting: Obstetrics and Gynecology

## 2012-08-13 NOTE — Telephone Encounter (Signed)
Tc to pt regarding bc pills. Pt has questions about when she should stop taking the mini pill and return back to taking her normal bc pills. Advised pt that she should continue to take the mini pill as long as she is breastfeeding. Pt voiced understanding.

## 2014-06-06 ENCOUNTER — Encounter: Payer: Self-pay | Admitting: Obstetrics and Gynecology

## 2017-11-07 ENCOUNTER — Encounter: Payer: Self-pay | Admitting: General Practice

## 2017-11-19 ENCOUNTER — Other Ambulatory Visit (HOSPITAL_COMMUNITY): Payer: Self-pay | Admitting: Obstetrics and Gynecology

## 2017-11-19 DIAGNOSIS — O3680X0 Pregnancy with inconclusive fetal viability, not applicable or unspecified: Secondary | ICD-10-CM

## 2017-11-20 ENCOUNTER — Ambulatory Visit (HOSPITAL_COMMUNITY)
Admission: RE | Admit: 2017-11-20 | Discharge: 2017-11-20 | Disposition: A | Discharge status: Home / Self Care | Payer: Self-pay | Source: Ambulatory Visit | Attending: Obstetrics and Gynecology | Admitting: Obstetrics and Gynecology

## 2017-11-20 ENCOUNTER — Encounter (HOSPITAL_COMMUNITY): Payer: Self-pay

## 2017-11-20 DIAGNOSIS — O3680X0 Pregnancy with inconclusive fetal viability, not applicable or unspecified: Secondary | ICD-10-CM | POA: Insufficient documentation

## 2018-08-05 NOTE — L&D Delivery Note (Signed)
Delivery Note   Patient Name: Brittany Atkinson DOB: 1980/11/27 MRN: [id number]  Date of admission: 05/01/2019 Delivering MD: Noralyn Pick  Date of delivery: 05/01/19 Type of delivery: SVD  Newborn Data: Live born female  Birth Weight:   APGAR: 15, 11  Newborn Delivery   Birth date/time: 05/01/2019 21:23:00 Delivery type: Vaginal, Spontaneous      Brittany Atkinson, 38 y.o., @ [redacted]w[redacted]d,  [id number], who was admitted for spontaneous active labor. I was called to the room when she progressed +1 station in the second stage of labor. BBOW, discussed keeping intact verus AROM, R/B/A given, pt consented to AROM, fetus and pt tolerated well, light meconium noted. Pt then was 2+ and complete after 10 mins. Pt di dnot feel the urge to push, but we tial pushed and pt made good progress. We pushing on her back, with the squat bar, on hands and knees then back on her back, then fetus crowned.  She pushed for 1hours.  She delivered a viable infant, cephalic and restituted to the Direct OA position over an intact perineum.  A nuchal cord   was not identified. The baby was placed on maternal abdomen while initial step of NRP were perfmored (Dry, Stimulated, and warmed). Hat placed on baby for thermoregulation. Delayed cord clamping was performed for 3 minutes.  Cord double clamped and cut.  Cord cut by Father. Apgar scores were 8 and 9. Prophylactic Pitocin was started in the third stage of labor for active management. The placenta delivered spontaneously, shultz, with a 3 vessel cord and was sent to LD.  Inspection revealed none. An examination of the vaginal vault and cervix was free from lacerations. The uterus was firm, bleeding stable. Placenta and umbilical artery blood gas were not sent.  There were no complications during the procedure.  Mom and baby skin to skin following delivery. Left in stable condition. Baby noted to have bruising on face. No facial presentation was noted only direct OA. Pt requesting early  discharge after 24 hours of PKU. Newborn received adequate treated for GSB+ with x2 doses of Penicillin, informed dad that due to facial bruising of infant, baby is at risk for hyperbilirubinemia, and peds may want to monitor bilirubin for the full 2 day stay, parents ook with that but dad really wants early discharge, will make PP rounds in the morning to reassess. ALos pt denies feeling lightheaded or dizzy currently, but endorse feeling lightheaded when pushing. Pt BP low 70/30s, x1 dose phenylephrine given and fluid bolus ordered. Pt and husband endorses pt has normal low BP of 100/60s. Epidural turned off by RN, BP currently resolved see below.  10:11 PM  BP 129/60   Pulse 88   Temp 97.9 F (36.6 C) (Oral)   Resp (P) 17   Ht 5\' 6"  (1.676 m)   Wt 74.8 kg   SpO2 98%   BMI 26.63 kg/m    Maternal Info: Anesthesia:Epidural Episiotomy:  no Lacerations:  none, slight perineal tear, hemostatic, no repair indicated Suture Repair: no Est. Blood Loss (mL):  minimal/23mls  Newborn Info:  Baby Sex: female Babies Name: [place] APGAR (1 MIN): 8   APGAR (5 MINS): 9   APGAR (10 MINS):     Mom to postpartum.  Baby to Couplet care / Skin to Skin.  Dr Landry Mellow updated on birth.    [place], [place], NP-C 05/01/19 10:01 PM

## 2018-10-26 LAB — OB RESULTS CONSOLE GC/CHLAMYDIA
Chlamydia: NEGATIVE
Gonorrhea: NEGATIVE

## 2018-10-27 LAB — OB RESULTS CONSOLE HEPATITIS B SURFACE ANTIGEN: Hepatitis B Surface Ag: NEGATIVE

## 2018-10-27 LAB — OB RESULTS CONSOLE RUBELLA ANTIBODY, IGM: Rubella: IMMUNE

## 2018-11-18 ENCOUNTER — Encounter: Payer: Self-pay | Admitting: General Practice

## 2019-01-18 IMAGING — US US OB TRANSVAGINAL
1 series · 15 of 28 positions shown · non-contrast
Comparison: None

CLINICAL DATA: First trimester pregnancy, assess viability, vaginal
bleeding for 3 days

EXAM:
OBSTETRIC <14 WK US AND TRANSVAGINAL OB US
TECHNIQUE: Both transabdominal and transvaginal ultrasound examinations were
performed for complete evaluation of the gestation as well as the
maternal uterus, adnexal regions, and pelvic cul-de-sac.
Transvaginal technique was performed to assess early pregnancy.

[Series 1: us ob transvaginal · 40 acquisitions, 15 frames shown]
[im 1/40]
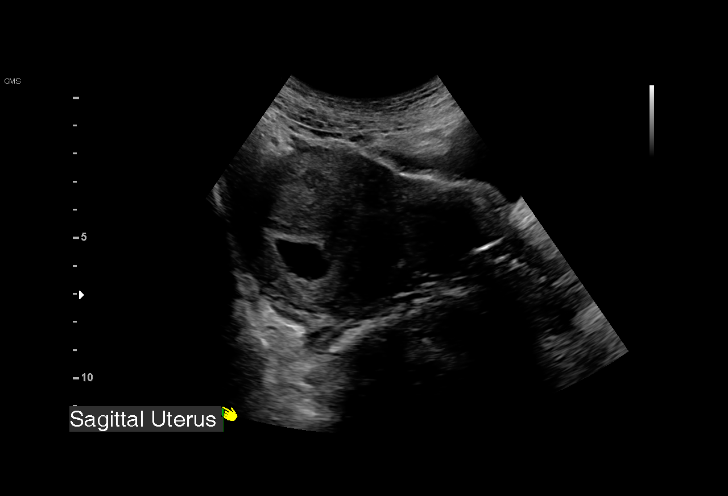
[im 3/40]
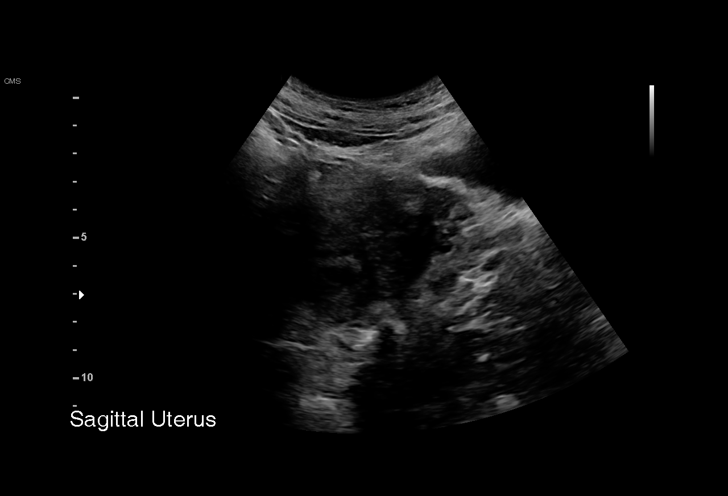
[im 6/40]
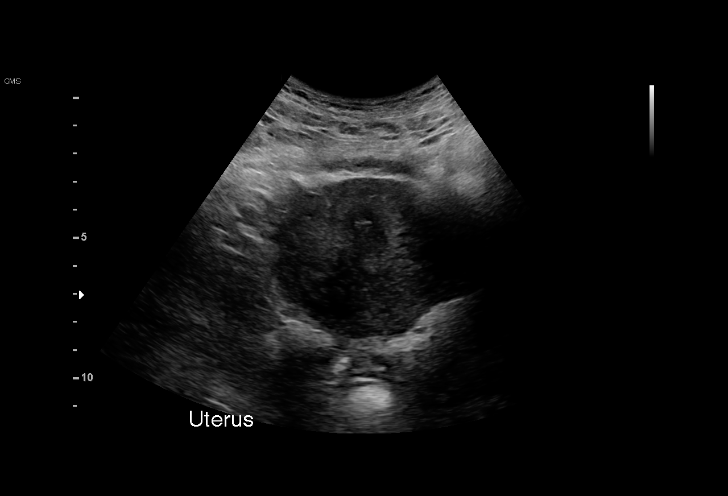
[im 9/40]
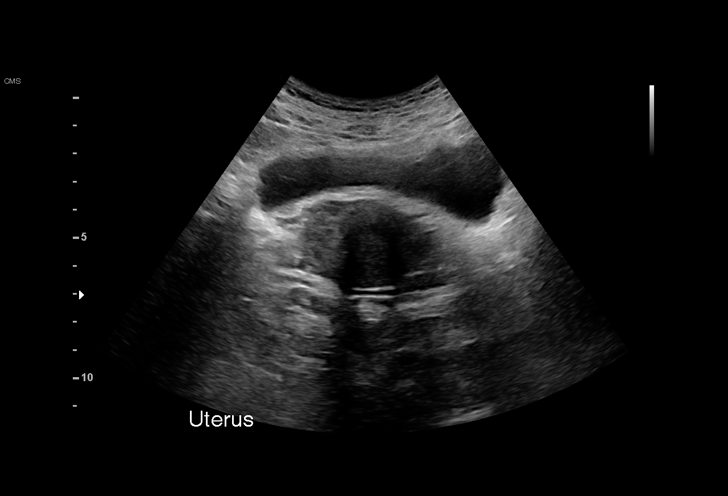
[im 12/40]
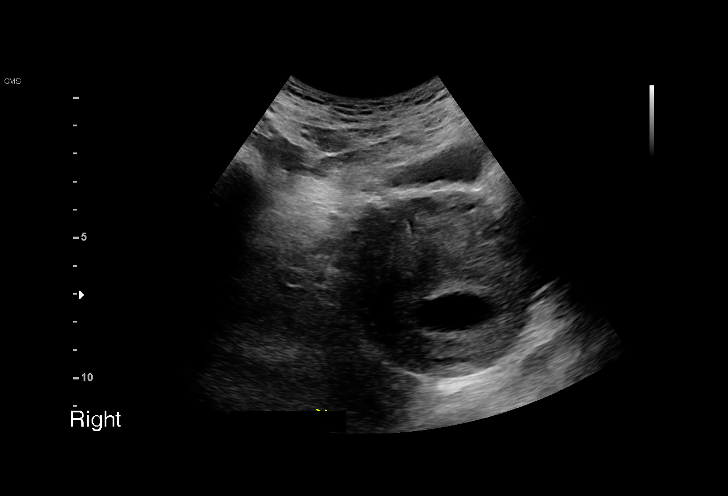
[im 15/40]
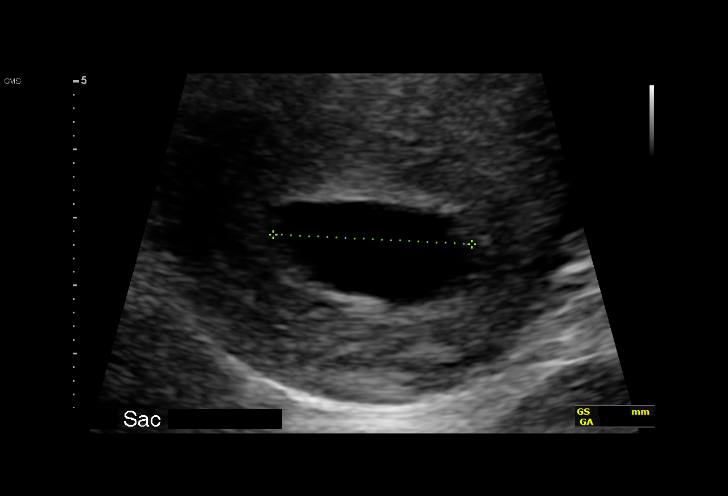
[im 18/40]
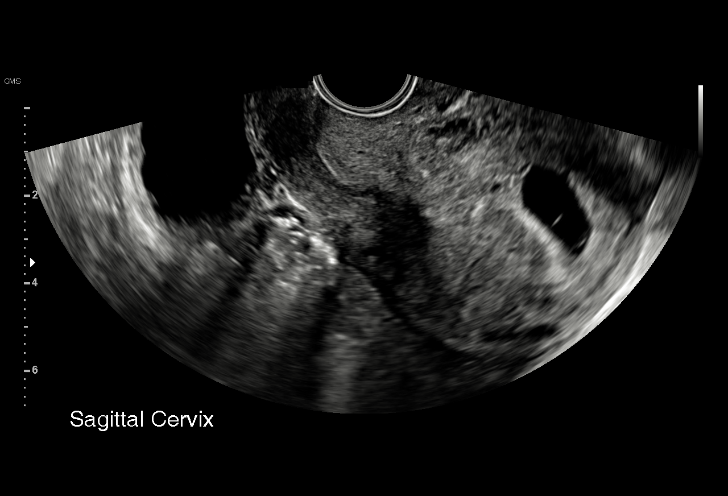
[im 21/40]
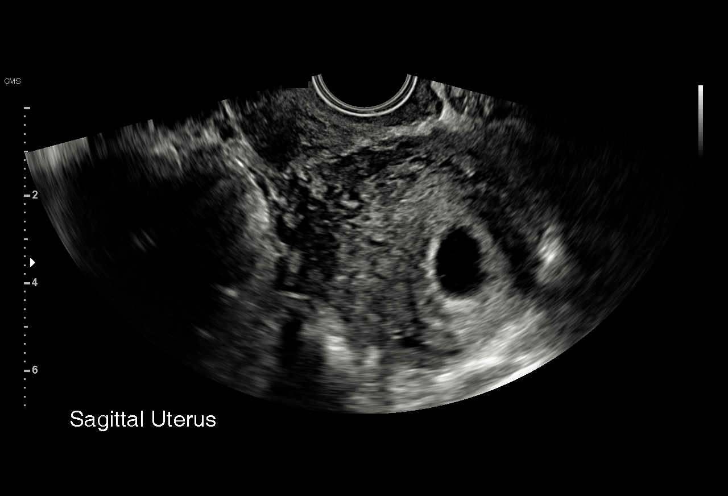
[im 22/40]
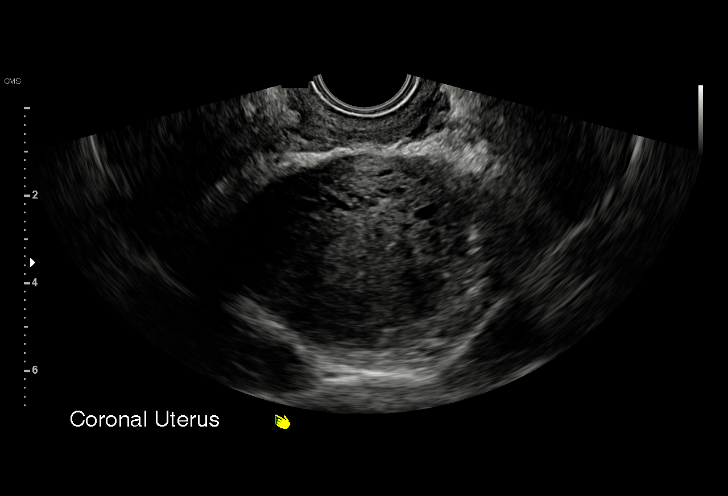
[im 25/40]
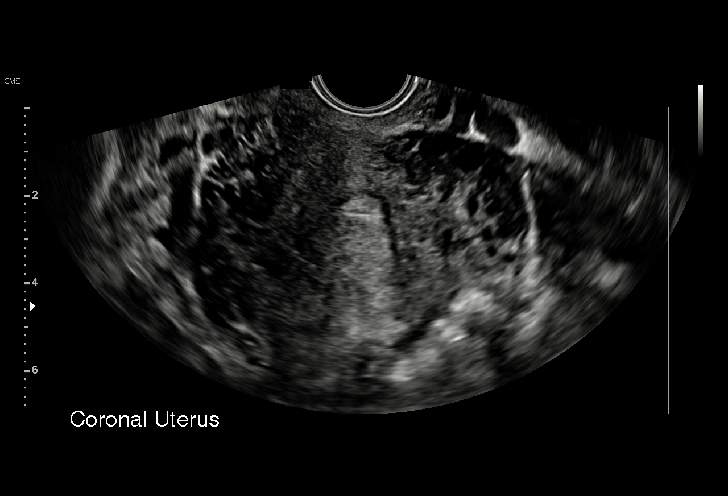
[im 28/40]
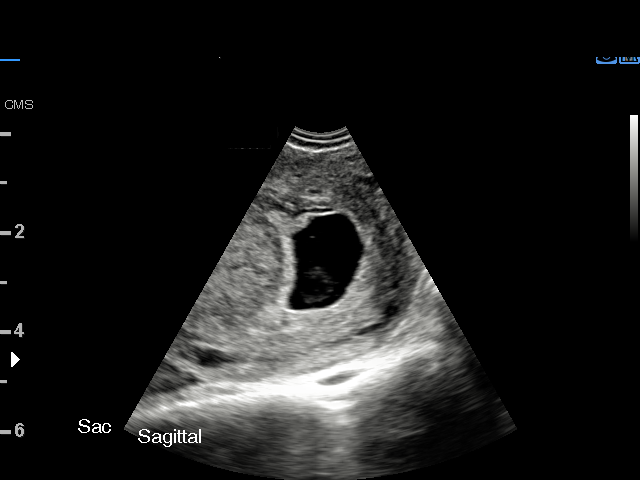
[im 31/40]
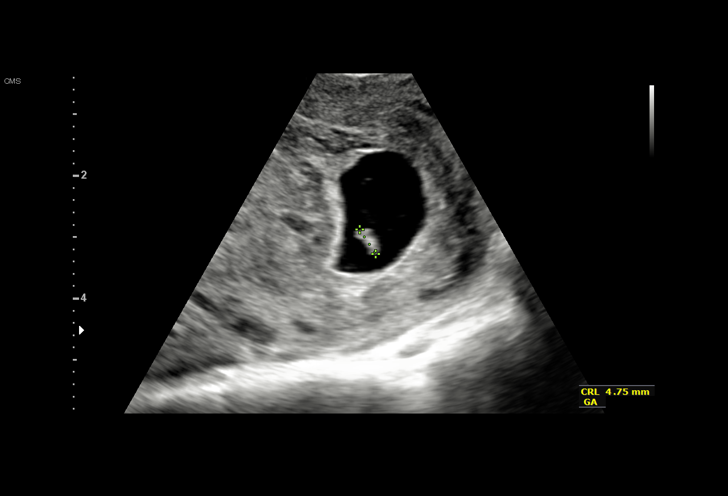
[im 34/40]
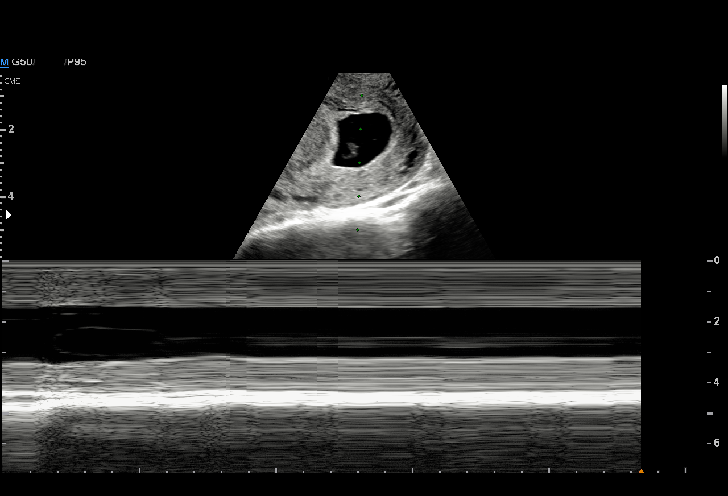
[im 37/40]
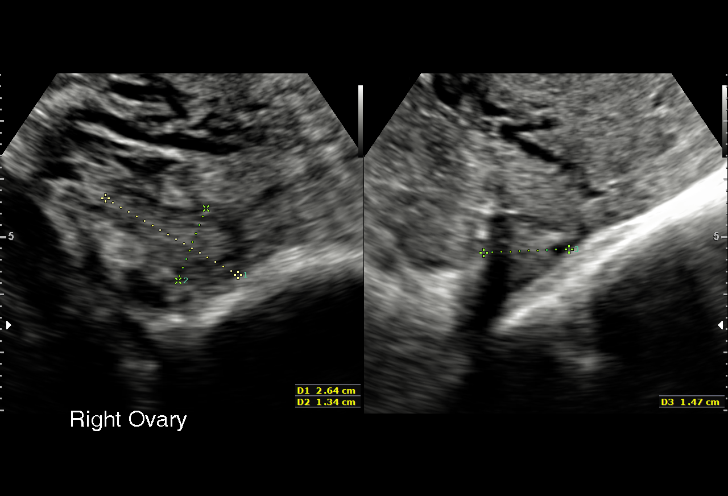
[im 40/40]
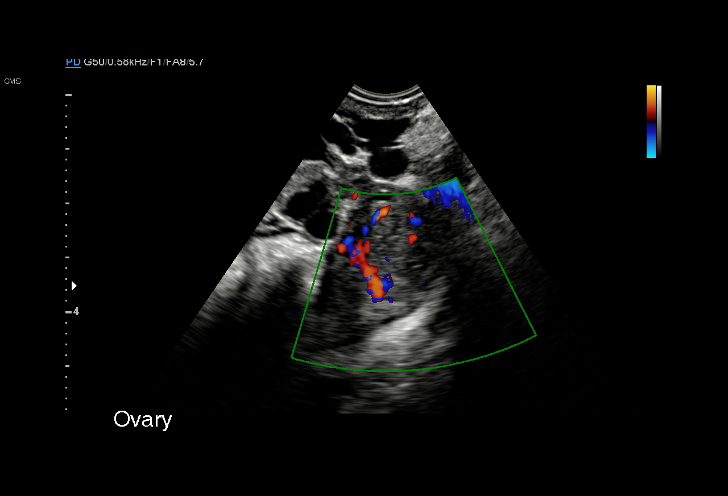

[15 of 28 positions shown; findings below may reference images not displayed]

FINDINGS: Intrauterine gestational sac: Present, single

Yolk sac:  Present

Embryo:  Present

Cardiac Activity: Absent

Heart Rate: N/A  bpm

CRL:  4.6 mm   6 w   1 d                  US EDC: 07/15/2018

Subchorionic hemorrhage:  None visualized.

Maternal uterus/adnexae:

Double bleb sign identified, with visualization of amnion.

RIGHT ovary normal size and morphology 2.6 x 1.3 x 1.5 cm.

LEFT ovary normal size morphology 1.9 x 2.6 x 1.8 cm.

No adnexal masses or free pelvic fluid.
IMPRESSION: Intrauterine gestational sac containing a yolk sac and fetal pole.

No fetal cardiac activity is identified.

Findings are suspicious but not yet definitive for failed pregnancy.
Recommend follow-up US in 10-14 days for definitive diagnosis. This
recommendation follows SRU consensus guidelines: Diagnostic Criteria
for Nonviable Pregnancy Early in the First Trimester. N Engl J Med

## 2019-02-08 LAB — OB RESULTS CONSOLE RPR: RPR: NONREACTIVE

## 2019-02-08 LAB — OB RESULTS CONSOLE HIV ANTIBODY (ROUTINE TESTING): HIV: NONREACTIVE

## 2019-03-29 LAB — OB RESULTS CONSOLE GBS: GBS: POSITIVE

## 2019-05-01 ENCOUNTER — Inpatient Hospital Stay (HOSPITAL_COMMUNITY): Payer: Self-pay | Admitting: Anesthesiology

## 2019-05-01 ENCOUNTER — Inpatient Hospital Stay (HOSPITAL_COMMUNITY)
Admission: AD | Admit: 2019-05-01 | Discharge: 2019-05-02 | DRG: 807 | Disposition: A | Discharge status: Home / Self Care | Payer: Self-pay | Attending: Obstetrics and Gynecology | Admitting: Obstetrics and Gynecology

## 2019-05-01 ENCOUNTER — Other Ambulatory Visit: Payer: Self-pay

## 2019-05-01 ENCOUNTER — Encounter (HOSPITAL_COMMUNITY): Payer: Self-pay

## 2019-05-01 DIAGNOSIS — Z880 Allergy status to penicillin: Secondary | ICD-10-CM

## 2019-05-01 DIAGNOSIS — Z3A41 41 weeks gestation of pregnancy: Secondary | ICD-10-CM

## 2019-05-01 DIAGNOSIS — O43123 Velamentous insertion of umbilical cord, third trimester: Secondary | ICD-10-CM | POA: Diagnosis present

## 2019-05-01 DIAGNOSIS — O99824 Streptococcus B carrier state complicating childbirth: Secondary | ICD-10-CM | POA: Diagnosis present

## 2019-05-01 DIAGNOSIS — Z20828 Contact with and (suspected) exposure to other viral communicable diseases: Secondary | ICD-10-CM | POA: Diagnosis present

## 2019-05-01 DIAGNOSIS — O480 Post-term pregnancy: Principal | ICD-10-CM | POA: Diagnosis present

## 2019-05-01 HISTORY — DX: Other specified health status: Z78.9

## 2019-05-01 LAB — CBC
HCT: 38.3 % (ref 36.0–46.0)
Hemoglobin: 13.2 g/dL (ref 12.0–15.0)
MCH: 31.1 pg (ref 26.0–34.0)
MCHC: 34.5 g/dL (ref 30.0–36.0)
MCV: 90.3 fL (ref 80.0–100.0)
Platelets: 227 10*3/uL (ref 150–400)
RBC: 4.24 MIL/uL (ref 3.87–5.11)
RDW: 12.9 % (ref 11.5–15.5)
WBC: 11.1 10*3/uL — ABNORMAL HIGH (ref 4.0–10.5)
nRBC: 0 % (ref 0.0–0.2)

## 2019-05-01 LAB — ABO/RH: ABO/RH(D): O POS

## 2019-05-01 LAB — SARS CORONAVIRUS 2 BY RT PCR (HOSPITAL ORDER, PERFORMED IN ~~LOC~~ HOSPITAL LAB): SARS Coronavirus 2: NEGATIVE

## 2019-05-01 LAB — TYPE AND SCREEN
ABO/RH(D): O POS
Antibody Screen: NEGATIVE

## 2019-05-01 MED ORDER — DIPHENHYDRAMINE HCL 50 MG/ML IJ SOLN: 12.5000 mg | INTRAMUSCULAR | Status: DC | PRN

## 2019-05-01 MED ORDER — TETANUS-DIPHTH-ACELL PERTUSSIS 5-2.5-18.5 LF-MCG/0.5 IM SUSP: 0.5000 mL | Freq: Once | INTRAMUSCULAR | Status: DC

## 2019-05-01 MED ORDER — SOD CITRATE-CITRIC ACID 500-334 MG/5ML PO SOLN: 30.0000 mL | ORAL | Status: DC | PRN

## 2019-05-01 MED ORDER — LACTATED RINGERS IV SOLN: 500.0000 mL | INTRAVENOUS | Status: DC | PRN

## 2019-05-01 MED ORDER — DIPHENHYDRAMINE HCL 25 MG PO CAPS: 25.0000 mg | ORAL_CAPSULE | Freq: Four times a day (QID) | ORAL | Status: DC | PRN

## 2019-05-01 MED ORDER — WITCH HAZEL-GLYCERIN EX PADS: 1.0000 "application " | MEDICATED_PAD | CUTANEOUS | Status: DC | PRN

## 2019-05-01 MED ORDER — LIDOCAINE HCL (PF) 1 % IJ SOLN
INTRAMUSCULAR | Status: DC | PRN
  Administered 2019-05-01: 11 mL via EPIDURAL

## 2019-05-01 MED ORDER — OXYCODONE-ACETAMINOPHEN 5-325 MG PO TABS: 1.0000 | ORAL_TABLET | ORAL | Status: DC | PRN

## 2019-05-01 MED ORDER — SODIUM CHLORIDE 0.9 % IV SOLN
5.0000 10*6.[IU] | Freq: Once | INTRAVENOUS | Status: AC
  Administered 2019-05-01: 5 10*6.[IU] via INTRAVENOUS
  Filled 2019-05-01: qty 5

## 2019-05-01 MED ORDER — ACETAMINOPHEN 325 MG PO TABS
650.0000 mg | ORAL_TABLET | ORAL | Status: DC | PRN
  Administered 2019-05-02 (×2): 650 mg via ORAL
  Filled 2019-05-01 (×2): qty 2

## 2019-05-01 MED ORDER — FENTANYL-BUPIVACAINE-NACL 0.5-0.125-0.9 MG/250ML-% EP SOLN
12.0000 mL/h | EPIDURAL | Status: DC | PRN
  Filled 2019-05-01: qty 250

## 2019-05-01 MED ORDER — PHENYLEPHRINE 40 MCG/ML (10ML) SYRINGE FOR IV PUSH (FOR BLOOD PRESSURE SUPPORT)
80.0000 ug | PREFILLED_SYRINGE | INTRAVENOUS | Status: DC | PRN
  Administered 2019-05-01: 80 ug via INTRAVENOUS

## 2019-05-01 MED ORDER — EPHEDRINE 5 MG/ML INJ: 10.0000 mg | INTRAVENOUS | Status: DC | PRN

## 2019-05-01 MED ORDER — LACTATED RINGERS IV SOLN
INTRAVENOUS | Status: DC
  Administered 2019-05-01: 17:00:00 via INTRAVENOUS

## 2019-05-01 MED ORDER — OXYTOCIN BOLUS FROM INFUSION
500.0000 mL | Freq: Once | INTRAVENOUS | Status: AC
  Administered 2019-05-01: 500 mL via INTRAVENOUS

## 2019-05-01 MED ORDER — ONDANSETRON HCL 4 MG/2ML IJ SOLN: 4.0000 mg | INTRAMUSCULAR | Status: DC | PRN

## 2019-05-01 MED ORDER — SIMETHICONE 80 MG PO CHEW: 80.0000 mg | CHEWABLE_TABLET | ORAL | Status: DC | PRN

## 2019-05-01 MED ORDER — OXYCODONE-ACETAMINOPHEN 5-325 MG PO TABS: 2.0000 | ORAL_TABLET | ORAL | Status: DC | PRN

## 2019-05-01 MED ORDER — PRENATAL MULTIVITAMIN CH
1.0000 | ORAL_TABLET | Freq: Every day | ORAL | Status: DC
  Administered 2019-05-02: 12:00:00 1 via ORAL
  Filled 2019-05-01: qty 1

## 2019-05-01 MED ORDER — ZOLPIDEM TARTRATE 5 MG PO TABS: 5.0000 mg | ORAL_TABLET | Freq: Every evening | ORAL | Status: DC | PRN

## 2019-05-01 MED ORDER — ONDANSETRON HCL 4 MG PO TABS: 4.0000 mg | ORAL_TABLET | ORAL | Status: DC | PRN

## 2019-05-01 MED ORDER — SENNOSIDES-DOCUSATE SODIUM 8.6-50 MG PO TABS
2.0000 | ORAL_TABLET | ORAL | Status: DC
  Administered 2019-05-02: 01:00:00 2 via ORAL
  Filled 2019-05-01: qty 2

## 2019-05-01 MED ORDER — PHENYLEPHRINE 40 MCG/ML (10ML) SYRINGE FOR IV PUSH (FOR BLOOD PRESSURE SUPPORT)
80.0000 ug | PREFILLED_SYRINGE | INTRAVENOUS | Status: DC | PRN
  Filled 2019-05-01 (×2): qty 10

## 2019-05-01 MED ORDER — SODIUM CHLORIDE (PF) 0.9 % IJ SOLN
INTRAMUSCULAR | Status: DC | PRN
  Administered 2019-05-01: 12 mL/h via EPIDURAL

## 2019-05-01 MED ORDER — OXYTOCIN 40 UNITS IN NORMAL SALINE INFUSION - SIMPLE MED
2.5000 [IU]/h | INTRAVENOUS | Status: DC
  Administered 2019-05-01: 22:00:00 2.5 [IU]/h via INTRAVENOUS
  Filled 2019-05-01: qty 1000

## 2019-05-01 MED ORDER — DIBUCAINE (PERIANAL) 1 % EX OINT: 1.0000 "application " | TOPICAL_OINTMENT | CUTANEOUS | Status: DC | PRN

## 2019-05-01 MED ORDER — COCONUT OIL OIL
1.0000 "application " | TOPICAL_OIL | Status: DC | PRN
  Administered 2019-05-02: 1 via TOPICAL

## 2019-05-01 MED ORDER — ACETAMINOPHEN 325 MG PO TABS: 650.0000 mg | ORAL_TABLET | ORAL | Status: DC | PRN

## 2019-05-01 MED ORDER — LACTATED RINGERS IV SOLN
500.0000 mL | Freq: Once | INTRAVENOUS | Status: AC
  Administered 2019-05-01: 500 mL via INTRAVENOUS

## 2019-05-01 MED ORDER — BENZOCAINE-MENTHOL 20-0.5 % EX AERO
1.0000 "application " | INHALATION_SPRAY | CUTANEOUS | Status: DC | PRN
  Administered 2019-05-02 (×2): 1 via TOPICAL
  Filled 2019-05-01 (×2): qty 56

## 2019-05-01 MED ORDER — PENICILLIN G 3 MILLION UNITS IVPB - SIMPLE MED
3.0000 10*6.[IU] | INTRAVENOUS | Status: DC
  Administered 2019-05-01: 3 10*6.[IU] via INTRAVENOUS
  Filled 2019-05-01: qty 100

## 2019-05-01 MED ORDER — IBUPROFEN 600 MG PO TABS
600.0000 mg | ORAL_TABLET | Freq: Four times a day (QID) | ORAL | Status: DC
  Administered 2019-05-02 (×4): 600 mg via ORAL
  Filled 2019-05-01 (×4): qty 1

## 2019-05-01 MED ORDER — ONDANSETRON HCL 4 MG/2ML IJ SOLN: 4.0000 mg | Freq: Four times a day (QID) | INTRAMUSCULAR | Status: DC | PRN

## 2019-05-01 MED ORDER — LIDOCAINE HCL (PF) 1 % IJ SOLN: 30.0000 mL | INTRAMUSCULAR | Status: DC | PRN

## 2019-05-01 NOTE — Anesthesia Preprocedure Evaluation (Signed)
Anesthesia Evaluation  Patient identified by MRN, date of birth, ID band Patient awake    Reviewed: Allergy & Precautions, H&P , Patient's Chart, lab work & pertinent test results  Airway Mallampati: III  TM Distance: >3 FB Neck ROM: full    Dental no notable dental hx. (+) Teeth Intact   Pulmonary neg pulmonary ROS,    Pulmonary exam normal breath sounds clear to auscultation       Cardiovascular negative cardio ROS   Rhythm:regular Rate:Normal     Neuro/Psych Depression negative neurological ROS     GI/Hepatic negative GI ROS, Neg liver ROS,   Endo/Other  negative endocrine ROS  Renal/GU negative Renal ROS  negative genitourinary   Musculoskeletal   Abdominal Normal abdominal exam  (+)   Peds  Hematology negative hematology ROS (+)   Anesthesia Other Findings   Reproductive/Obstetrics (+) Pregnancy                             Anesthesia Physical  Anesthesia Plan  ASA: II  Anesthesia Plan: Epidural   Post-op Pain Management:    Induction:   PONV Risk Score and Plan:   Airway Management Planned:   Additional Equipment:   Intra-op Plan:   Post-operative Plan:   Informed Consent: I have reviewed the patients History and Physical, chart, labs and discussed the procedure including the risks, benefits and alternatives for the proposed anesthesia with the patient or authorized representative who has indicated his/her understanding and acceptance.       Plan Discussed with: Anesthesiologist  Anesthesia Plan Comments:         Anesthesia Quick Evaluation

## 2019-05-01 NOTE — Progress Notes (Signed)
Labor Progress Note  Brittany Atkinson is a 38 y.o. female, G3P2002, IUP at 41.4 weeks, presenting for normal spontaneous labor , cxt Q5M, painful but breathing through them. Desires epidural ASAP. GBS+. Baby Female. Pt has h/o AMA, ASCUS, neg HPV, and marginal cord insertion. EFW on 9/18 was 8.9lbs. Pt endorses having a dry throat that makes her cough from time to time, but denies any other s/sx of URI. Has Birthing plan in hand. Pt endorse + Fm. Denies vaginal leakage. Denies vaginal bleeding.  Subjective: Pt in bed resting on left side with peanut ball, pt comfortable post epidural placement, pt without complaints. Husband super supportive at bedside.  Patient Active Problem List   Diagnosis Date Noted  . Normal labor and delivery 05/01/2019  . UTI (lower urinary tract infection) 02/26/2012  . Vaginal delivery 02/24/2012  . Depression - antenatal?  01/01/2012   Objective: BP 115/79   Pulse 80   Temp 97.9 F (36.6 C) (Oral)   Resp 16   Ht 5\' 6"  (1.676 m)   Wt 74.8 kg   SpO2 98%   BMI 26.63 kg/m  No intake/output data recorded. No intake/output data recorded. NST: FHR baseline 120 bpm, Variability: moderate, Accelerations:present, Decelerations:  Absent= Cat 1/Reactive CTX:  regular, every 2-4 minutes, lasting 80-110 Uterus gravid, soft non tender, moderate to palpate with contractions.  SVE:  Dilation: 7 Effacement (%): 90, 100 Station: 0 Exam by:: [facility] CNM  Assessment:  Brittany Atkinson is a 38 y.o. female, G3P2002, IUP at 41.4 weeks, presenting for normal spontaneous labor , cxt Q5M, painful but breathing through them. Desires epidural ASAP. GBS+. Baby Female. Pt has h/o AMA, ASCUS, neg HPV, and marginal cord insertion. EFW on 9/18 was 8.9lbs. Pt endorses having a dry throat that makes her cough from time to time, but denies any other s/sx of URI. Has Birthing plan in hand. Pt comfortable after epidural placement. Progressing in active labor. Naturally.  Patient Active  Problem List   Diagnosis Date Noted  . Normal labor and delivery 05/01/2019  . UTI (lower urinary tract infection) 02/26/2012  . Vaginal delivery 02/24/2012  . Depression - antenatal?  01/01/2012   NICHD: Category 1  Membranes:  Intact, no s/s of infection  Pain management:               Epidural placement:  at 1550 on 9/26  GBS Positive  Abx: Pen x1 dose @ 1610  Plan: Continue labor plan GBS+: Next does Penicillin @ 2000 Continuous/intermittent monitoring Rest/Peanut ball Frequent position changes to facilitate fetal rotation and descent. Will reassess with cervical exam at 2000 or earlier if necessary Anticipate labor progression and vaginal delivery.   Noralyn Pick, NP-C, CNM, MSN 05/01/2019. 5:57 PM

## 2019-05-01 NOTE — MAU Note (Signed)
Pt states she has been having contractions all night but tried to sleep through it. She said they intensified around 1030 this morning and have consistently gotten closer together. Denies LOF/VB. Reports good fetal movement.

## 2019-05-01 NOTE — Anesthesia Procedure Notes (Signed)
Epidural Patient location during procedure: OB Start time: 05/01/2019 3:56 PM End time: 05/01/2019 4:08 PM  Staffing Anesthesiologist: Lynda Rainwater, MD Performed: anesthesiologist   Preanesthetic Checklist Completed: patient identified, site marked, surgical consent, pre-op evaluation, timeout performed, IV checked, risks and benefits discussed and monitors and equipment checked  Epidural Patient position: sitting Prep: ChloraPrep Patient monitoring: heart rate, cardiac monitor, continuous pulse ox and blood pressure Approach: midline Location: L2-L3 Injection technique: LOR saline  Needle:  Needle type: Tuohy  Needle gauge: 17 G Needle length: 9 cm Needle insertion depth: 4 cm Catheter type: closed end flexible Catheter size: 20 Guage Catheter at skin depth: 8 cm Test dose: negative  Assessment Events: blood not aspirated, injection not painful, no injection resistance, negative IV test and no paresthesia  Additional Notes Reason for block:procedure for pain

## 2019-05-01 NOTE — H&P (Signed)
Brittany Atkinson is a 38 y.o. female, G3P2002, IUP at 41.4 weeks, presenting for normal spontaneous labor , cxt Q5M, painful but breathing through them. Desires epidural ASAP. GBS+. Baby Female. Pt has h/o AMA, ASCUS, neg HPV, and marginal cord insertion. EFW on 9/18 was 8.9lbs. Pt endorses having a dry throat that makes her cough from time to time, but denies any other s/sx of URI. Has Birthing plan in hand. Pt endorse + Fm. Denies vaginal leakage. Denies vaginal bleeding.  Patient Active Problem List   Diagnosis Date Noted  . UTI (lower urinary tract infection) 02/26/2012  . Vaginal delivery 02/24/2012  . Depression - antenatal?  01/01/2012    Medications Prior to Admission  Medication Sig Dispense Refill Last Dose  . ALPHA LIPOIC ACID PO Take 1 capsule by mouth daily.     . Ascorbic Acid (VITAMIN C PO) Take 1 tablet by mouth daily.     . B Complex Vitamins (VITAMIN B COMPLEX PO) Take 1 tablet by mouth daily.     Marland Kitchen Cod Liver Oil CAPS Take 1 capsule by mouth daily.     . fish oil-omega-3 fatty acids 1000 MG capsule Take 1 g by mouth daily.     . norethindrone (ORTHO MICRONOR) 0.35 MG tablet Take 1 tablet (0.35 mg total) by mouth daily. 3 Package 3   . Prenatal Vit-Fe Fumarate-FA (PRENATAL MULTIVITAMIN) TABS Take 1 tablet by mouth daily.       Past Medical History:  Diagnosis Date  . Active labor 02/24/2012  . H/O toxoplasmosis   . H/O varicella   . Hx: UTI (urinary tract infection) 2010  . Labor and delivery complicated by meconium in amniotic fluid 02/24/2012     No current facility-administered medications on file prior to encounter.    Current Outpatient Medications on File Prior to Encounter  Medication Sig Dispense Refill  . ALPHA LIPOIC ACID PO Take 1 capsule by mouth daily.    . Ascorbic Acid (VITAMIN C PO) Take 1 tablet by mouth daily.    . B Complex Vitamins (VITAMIN B COMPLEX PO) Take 1 tablet by mouth daily.    Marland Kitchen Cod Liver Oil CAPS Take 1 capsule by mouth daily.    .  fish oil-omega-3 fatty acids 1000 MG capsule Take 1 g by mouth daily.    . norethindrone (ORTHO MICRONOR) 0.35 MG tablet Take 1 tablet (0.35 mg total) by mouth daily. 3 Package 3  . Prenatal Vit-Fe Fumarate-FA (PRENATAL MULTIVITAMIN) TABS Take 1 tablet by mouth daily.       No Known Allergies  History of present pregnancy: Pt Info/Preference:  Screening/Consents:  Labs:   EDD: Estimated Date of Delivery: 04/20/19  Establised: No LMP recorded. Patient is pregnant.  Anatomy Scan: Date: 12/01/2018 Placenta Location: posterior Genetic Screen: Panoroma:low risk female  AFP:  First Tri: Quad:  Office: [facility]            First PNV: 15 wg Blood Type  o+  Language: english Last PNV: 41.3 wg Rhogam  no  Flu Vaccine:  declined   Antibody  neg  TDaP vaccine declined   GTT: Early: 5.0 hga1c Third Trimester: normal  Feeding Plan: breast BTL: no Rubella:  immune  Contraception: ??? VBAC: no RPR:   NR  Circumcision: N/A   HBsAg:  neg  Pediatrician:  ???   HIV:   nr  Prenatal Classes: no Additional [place]: 9/18 growth see below GBS:  (For PCN allergy, check sensitivities)  Chlamydia: neg    MFM Referral/Consult:  GC: neg  Support Person: husband   PAP: 2020-ASUC, -HPV  Pain Management: epidural Neonatologist Referral:  Hgb Electrophoresis:  AA  Birth Plan: In hand   Hgb NOB: 13    28W: 12.2  04/23/2019 growth with BPP:  OB History    Gravida  3   Para  2   Term  2   Preterm  0   AB  0   Living  2     SAB  0   TAB  0   Ectopic  0   Multiple  0   Live Births  2          Past Medical History:  Diagnosis Date  . Active labor 02/24/2012  . H/O toxoplasmosis   . H/O varicella   . Hx: UTI (urinary tract infection) 2010  . Labor and delivery complicated by meconium in amniotic fluid 02/24/2012   Past Surgical History:  Procedure Laterality Date  . NO PAST SURGERIES     Family History: family history includes Cancer in her maternal grandmother and paternal grandmother;  Parkinsonism in her maternal grandfather; Seizures in her maternal grandmother. Social History:  reports that she has never smoked. She has never used smokeless tobacco. She reports that she does not drink alcohol or use drugs.   Prenatal Transfer Tool  Maternal Diabetes: No Genetic Screening: Normal Maternal Ultrasounds/Referrals: Normal Fetal Ultrasounds or other Referrals:  None, marginal cord insertion  Maternal Substance Abuse:  No Significant Maternal Medications:  None Significant Maternal Lab Results: Group B Strep positive  ROS:  Review of Systems  Constitutional: Negative.   HENT: Negative.   Eyes: Negative.   Respiratory: Negative.   Cardiovascular: Negative.   Gastrointestinal: Positive for abdominal pain.  Genitourinary: Negative.   Musculoskeletal: Negative.   Skin: Negative.   Neurological: Negative.   Endo/Heme/Allergies: Negative.   Psychiatric/Behavioral: Negative.      Physical Exam: BP 130/75 (BP Location: Left Arm)   Pulse 82   Temp 98 F (36.7 C) (Oral)   Resp 17   SpO2 100%   Physical Exam  Constitutional: She is oriented to person, place, and time and well-developed, well-nourished, and in no distress.  HENT:  Head: Normocephalic and atraumatic.  Eyes: Pupils are equal, round, and reactive to light. Conjunctivae are normal.  Neck: Normal range of motion. Neck supple.  Cardiovascular: Normal rate and regular rhythm.  Pulmonary/Chest: Effort normal and breath sounds normal.  Abdominal: Soft. Bowel sounds are normal.  Genitourinary:    Genitourinary Comments: Uterus gravida, soft non-tender, cxt moderate to palpate. Pelvis adequate for vaginal delivery    Musculoskeletal: Normal range of motion.  Neurological: She is alert and oriented to person, place, and time. Gait normal.  Skin: Skin is warm and dry.  Psychiatric: Affect normal.  Nursing note and vitals reviewed.    NST: FHR baseline 130 bpm, Variability: moderate, Accelerations:present,  Decelerations:  Absent= Cat 1/Reactive UC:   none SVE:   Dilation: 5 Effacement (%): 90 Station: -1 Exam by:: [facility] Zekiah Caruth, CNM, vertex verified by fetal sutures.  Leopold's: Position vertex, EFW 8lbs via leopold's.  Pelvic proven to 9 lbs  Labs: No results found for this or any previous visit (from the past 24 hour(s)).  Imaging:  No results found.  MAU Course: Orders Placed This Encounter  Procedures  . Cervical Exam   No orders of the defined types were placed in this encounter.   Assessment/Plan: Brittany Atkinson  is a 38 y.o. female, G3P2002, IUP at 41.4 weeks, presenting for normal spontaneous labor , cxt Q5M, painful but breathing through them. Desires epidural ASAP. GBS+. Baby Female. Pt has h/o AMA, ASCUS, neg HPV, and marginal cord insertion. EFW on 9/18 was 8.9lbs. Pt endorses having a dry throat that makes her cough from time to time, but denies any other s/sx of URI. Has Birthing plan in hand. Pt endorse + Fm. Denies vaginal leakage. Denies vaginal bleeding.  FWB: Cat 1 Fetal Tracing.   Plan: Admit to [facility] per consult with Dr Landry Mellow Routine [facility] orders Birthing plan in hand  Pain med/epidural prn PCN G for GBS prophylaxis  Anticipate labor progression   Noralyn Pick NP-C, CNM, MSN 05/01/2019, 2:45 PM

## 2019-05-02 LAB — CBC
HCT: 33.4 % — ABNORMAL LOW (ref 36.0–46.0)
Hemoglobin: 11.3 g/dL — ABNORMAL LOW (ref 12.0–15.0)
MCH: 30.8 pg (ref 26.0–34.0)
MCHC: 33.8 g/dL (ref 30.0–36.0)
MCV: 91 fL (ref 80.0–100.0)
Platelets: 197 10*3/uL (ref 150–400)
RBC: 3.67 MIL/uL — ABNORMAL LOW (ref 3.87–5.11)
RDW: 12.7 % (ref 11.5–15.5)
WBC: 14.9 10*3/uL — ABNORMAL HIGH (ref 4.0–10.5)
nRBC: 0 % (ref 0.0–0.2)

## 2019-05-02 MED ORDER — IBUPROFEN 600 MG PO TABS: 600.0000 mg | ORAL_TABLET | Freq: Four times a day (QID) | ORAL | 0 refills | Status: AC

## 2019-05-02 MED ORDER — OXYCODONE HCL 5 MG PO TABS
5.0000 mg | ORAL_TABLET | ORAL | Status: DC | PRN
  Administered 2019-05-02: 5 mg via ORAL
  Filled 2019-05-02: qty 1

## 2019-05-02 NOTE — Anesthesia Postprocedure Evaluation (Signed)
Anesthesia Post Note  Patient: Belicia Chapel  Procedure(s) Performed: AN AD [place]     Patient location during evaluation: Mother Baby Anesthesia Type: Epidural Level of consciousness: awake and alert Pain management: pain level controlled Vital Signs Assessment: post-procedure vital signs reviewed and stable Respiratory status: spontaneous breathing, nonlabored ventilation and respiratory function stable Cardiovascular status: stable Postop Assessment: no headache, no backache, epidural receding, no apparent nausea or vomiting, patient able to bend at knees, adequate PO intake and able to ambulate Anesthetic complications: no    Last Vitals:  Vitals:   05/02/19 0030 05/02/19 0630  BP: 108/69 98/67  Pulse: 77 63  Resp: 16   Temp: 36.9 C 36.6 C  SpO2: 97% 98%    Last Pain:  Vitals:   05/02/19 0638  TempSrc:   PainSc: 3    Pain Goal:                   Jabier Mutton

## 2019-05-02 NOTE — Lactation Note (Signed)
This note was copied from a baby's chart. Lactation Consultation Note  Patient Name: Brittany Atkinson [id number] Date: 05/02/2019 Reason for consult: Follow-up assessment;Term  P3 mother whose infant is now 21 hours old.  Mother breast fed her other two children (now 12 and almost 39 years old) for 2 years each.  Baby was finishing her hearing screen when I arrived.  Mother was interested in having my assistance with latching after the hearing screen was complete.  Mother has been having pain with latching.  She has very sensitive nipples and stated that "this happens with every pregnancy."  She stated that it usually takes about a week and then "everything is fine" with her nipples and breast feeding.  Her breasts are soft and non tender and nipples are everted and intact.  Both nipples look reddened and irritated but no breakdown noted.  Mother has been using EBM and now also has coconut oil for comfort.  I provided comfort gels with instruction for use.  Had mother demonstrate hand expression.  Revised her technique and she was able to express colostrum drops which I finger fed back to baby.  Assisted to latch in the football hold on the right breast without difficulty.  Demonstrated breast compressions and gentle stimulation.  Mother had a habit of making an "air hole" next to baby's nose.  She was afraid baby could not breathe.  Reviewed obtaining a deep latch and demonstrated how calmly baby was feeding during her session at the breast.  Also showed parents how to perform the chin tug if necessary.  Mother has been told she is "doing all the right things" and feels like she just needs time to get used to the tenderness and sensitivity with latching.  Baby does have a strong suck on my gloved finger but sucks rhythmically and without difficulty.    Mother will continue to feed 8-12 times/24 hours or sooner if she shows feeding cues.  She will include hand expression before/after feedings to help  increase milk supply.  Mother will call for latch assistance as needed.  Father present and supportive.     Maternal Data Formula Feeding for Exclusion: No Has patient been taught Hand Expression?: Yes Does the patient have breastfeeding experience prior to this delivery?: Yes  Feeding Feeding Type: Breast Fed  LATCH Score Latch: Grasps breast easily, tongue down, lips flanged, rhythmical sucking.  Audible Swallowing: A few with stimulation  Type of Nipple: Everted at rest and after stimulation  Comfort (Breast/Nipple): Filling, red/small blisters or bruises, mild/mod discomfort  Hold (Positioning): Assistance needed to correctly position infant at breast and maintain latch.  LATCH Score: 7  Interventions Interventions: Breast feeding basics reviewed;Assisted with latch;Skin to skin;Breast massage;Hand express;Breast compression;Coconut oil;Comfort gels;Position options;Support pillows;Adjust position  Lactation Tools Discussed/Used WIC Program: No   Consult Status Consult Status: Follow-up Date: 05/03/19 Follow-up type: In-patient    [name] 05/02/2019, 1:27 PM

## 2019-05-02 NOTE — Discharge Summary (Signed)
SVD OB Discharge Summary     Patient Name: Novalynn Branaman DOB: 30-Apr-1981 MRN: [id number]  Date of admission: 05/01/2019 Delivering MD: Noralyn Pick  Date of delivery: 05/01/2019 Type of delivery: SVD  Newborn Data: Sex: Baby female Name: "tori" Live born female  Birth Weight: 8 lb 2.2 oz (3691 g) APGAR: 48, 9  Newborn Delivery   Birth date/time: 05/01/2019 21:23:00 Delivery type: Vaginal, Spontaneous      Feeding: breast Infant being discharge to home with mother in stable condition.   Admitting diagnosis: CTX less than 5 minutes Intrauterine pregnancy: [redacted]w[redacted]d     Secondary diagnosis:  Active Problems:   Normal labor and delivery   Normal postpartum course                                Complications: None                                                              Intrapartum Procedures: spontaneous vaginal delivery and GBS prophylaxis Postpartum Procedures: none Complications-Operative and Postpartum: none Augmentation: AROM and at time of pushing    History of Present Illness: Ms. Kaylanie Capili is a 38 y.o. female, G3P2002, who presents at [redacted]w[redacted]d weeks gestation. The patient has been followed at  [facility]  Her pregnancy has been complicated by:  Patient Active Problem List   Diagnosis Date Noted  . Normal postpartum course 05/02/2019  . Normal labor and delivery 05/01/2019  . UTI (lower urinary tract infection) 02/26/2012  . Vaginal delivery 02/24/2012  . Depression - antenatal?  01/01/2012    Hospital course:  Onset of Labor With Vaginal Delivery     38 y.o. yo G3P2002 at [redacted]w[redacted]d was admitted in Active Labor on 05/01/2019. Patient had an uncomplicated labor course as follows:  Membrane Rupture Time/Date: 8:22 PM ,05/01/2019   Intrapartum Procedures: Episiotomy: None [1]                                         Lacerations:  None [1]  Patient had a delivery of a Viable infant. 05/01/2019  Information for the patient's  newborn:  Humaira, Sculley Girl Dory [[id number]]       Pateint had an uncomplicated postpartum course.  She is ambulating, tolerating a regular diet, passing flatus, and urinating well. Patient is discharged home in stable condition on 05/02/19.  Postpartum Day # 1 : S/P NSVD due to spontaneous labor and delivery with bgs+ and x2 doses of penicillin administer before dleivery. Patient up ad lib, denies syncope or dizziness. Reports consuming regular diet without issues and denies N/V. Patient reports 0 bowel movement + passing flatus.  Denies issues with urination and reports bleeding is "lighter."  Patient is breastfeeding and reports going well.  Desires vasectomy for husband for postpartum contraception.  Pain is being appropriately managed with use of po meds.   Physical exam  Vitals:   05/01/19 2312 05/02/19 0030 05/02/19 0630 05/02/19 1003  BP: 118/77 108/69 98/67 102/70  Pulse: 75 77 63 64  Resp: 16 16    Temp: 98.5 F (36.9 C) 98.5 F (  36.9 C) 97.9 F (36.6 C) 97.8 F (36.6 C)  TempSrc:  Oral Oral   SpO2: 98% 97% 98% 98%  Weight:      Height:       General: alert, cooperative and no distress Lochia: appropriate Uterine Fundus: firm Perineum: Intact, approximate , no hematomas noted DVT Evaluation: No evidence of DVT seen on physical exam. Negative Homan's sign. No cords or calf tenderness. No significant calf/ankle edema.  Labs: Lab Results  Component Value Date   WBC 14.9 (H) 05/02/2019   HGB 11.3 (L) 05/02/2019   HCT 33.4 (L) 05/02/2019   MCV 91.0 05/02/2019   PLT 197 05/02/2019   No flowsheet data found.  Date of discharge: 05/02/2019 Discharge Diagnoses: Post-date pregnancy Discharge instruction: per After Visit Summary and "Baby and Me Booklet".  After visit meds:   Activity:           unrestricted and pelvic rest Advance as tolerated. Pelvic rest for 6 weeks.  Diet:                routine Medications: PNV and Ibuprofen Postpartum contraception:  Vasectomy Condition:  Pt discharge to home with baby in stable  Meds: Allergies as of 05/02/2019   No Known Allergies     Medication List    TAKE these medications   ALPHA LIPOIC ACID PO Take 1 capsule by mouth daily.   Cod Liver Oil Caps Take 1 capsule by mouth daily.   fish oil-omega-3 fatty acids 1000 MG capsule Take 1 g by mouth daily.   ibuprofen 600 MG tablet Commonly known as: ADVIL Take 1 tablet (600 mg total) by mouth every 6 (six) hours.   norethindrone 0.35 MG tablet Commonly known as: Ortho Micronor Take 1 tablet (0.35 mg total) by mouth daily.   prenatal multivitamin Tabs tablet Take 1 tablet by mouth daily.   VITAMIN B COMPLEX PO Take 1 tablet by mouth daily.   VITAMIN C PO Take 1 tablet by mouth daily.       Discharge Follow Up:  Follow-up Information    [facility]. Schedule an appointment as soon as possible for a visit in 6 week(s).   Specialty: Obstetrics and Gynecology Contact information: [address] [place] [address] [place] [address] [phone]           [place], NP-C, CNM 05/02/2019, 11:07 AM  Dale Bellerose, FNP

## 2019-05-02 NOTE — Lactation Note (Signed)
This note was copied from a baby's chart. Lactation Consultation Note  Patient Name: Brittany Atkinson [id number] Date: 05/02/2019 Reason for consult: Initial assessment     LC Initial Visit:  Attempted to visit with mother, however, she was asleep.  Baby and support person sleeping.  Will return later today.             Consult Status Consult Status: Follow-up Date: 05/02/19 Follow-up type: In-patient    Perl Kerney R Kyelle Urbas 05/02/2019, 11:15 AM

## 2019-05-07 LAB — RPR: RPR Ser Ql: NONREACTIVE — AB

## 2019-06-13 ENCOUNTER — Telehealth (HOSPITAL_COMMUNITY): Payer: Self-pay | Admitting: Lactation Services

## 2019-06-13 NOTE — Telephone Encounter (Signed)
Mom asked what could she do for the treatment of Mastitis. Mom hasn't been to the Dr. For evaluation. Advised mom to call MD.  Explained to mom she would probably need antibiotic.  Mom states she has hot, red, hard, tender to touch area to one breast. Asked mom if she has any cracked nipples. Mom stated no. She did the first week after delivery but has healed and BF well. Baby is 35 weeks old. Asked mom if she felt she has a clogged duct, mom replied yes. Suggested to mom the following: 1. Call MD in am. 2. Apply warm moist heat before BF. 3. Massage breast during BF. 4. BF leaning over baby some to let gravity help release clog. 5. BF in different positions some feedings. 6. Post pump to empty breast. 7. If mom can take Ibuprofen for inflammation and pain do so as directed. 8. If mom still having pain between Ibuprofen doses take Tylenol as directed if she can as directed. 9. If mom having a lot of swelling to breast may apply ICE. But warm moist heat before feeding/pumping. 10. Call for OP LC appt. If needed.

## 2019-07-13 ENCOUNTER — Encounter (HOSPITAL_COMMUNITY): Payer: Self-pay

## 2022-04-18 ENCOUNTER — Other Ambulatory Visit: Payer: Self-pay | Admitting: Surgery

## 2022-04-18 ENCOUNTER — Other Ambulatory Visit (HOSPITAL_COMMUNITY): Payer: Self-pay | Admitting: Surgery

## 2022-04-18 DIAGNOSIS — K4090 Unilateral inguinal hernia, without obstruction or gangrene, not specified as recurrent: Secondary | ICD-10-CM

## 2022-05-02 ENCOUNTER — Ambulatory Visit (HOSPITAL_COMMUNITY)
Admission: RE | Admit: 2022-05-02 | Discharge: 2022-05-02 | Disposition: A | Discharge status: Home / Self Care | Payer: Self-pay | Source: Ambulatory Visit | Attending: Surgery | Admitting: Surgery

## 2022-05-02 ENCOUNTER — Encounter (HOSPITAL_COMMUNITY): Payer: Self-pay

## 2022-05-02 DIAGNOSIS — K4090 Unilateral inguinal hernia, without obstruction or gangrene, not specified as recurrent: Secondary | ICD-10-CM | POA: Insufficient documentation

## 2022-05-02 MED ORDER — IOHEXOL 300 MG/ML  SOLN
100.0000 mL | Freq: Once | INTRAMUSCULAR | Status: AC | PRN
  Administered 2022-05-02: 100 mL via INTRAVENOUS

## 2022-05-02 MED ORDER — SODIUM CHLORIDE (PF) 0.9 % IJ SOLN
INTRAMUSCULAR | Status: AC
  Filled 2022-05-02: qty 50

## 2023-10-16 ENCOUNTER — Other Ambulatory Visit: Payer: Self-pay | Admitting: Medical Genetics

## 2023-10-21 ENCOUNTER — Other Ambulatory Visit (HOSPITAL_COMMUNITY)
Admission: RE | Admit: 2023-10-21 | Discharge: 2023-10-21 | Disposition: A | Discharge status: Home / Self Care | Payer: Self-pay | Source: Ambulatory Visit | Attending: Medical Genetics | Admitting: Medical Genetics

## 2023-11-03 LAB — GENECONNECT MOLECULAR SCREEN: Genetic Analysis Overall Interpretation: NEGATIVE
